# Patient Record
Sex: Female | Born: 1975 | Race: White | Hispanic: Yes | Marital: Single | Smoking: Never smoker
Health system: Southern US, Community
[De-identification: ages and names within clinical notes are randomized; demographics above are authoritative.]

## PROBLEM LIST (undated history)

## (undated) DIAGNOSIS — K219 Gastro-esophageal reflux disease without esophagitis: Secondary | ICD-10-CM

## (undated) HISTORY — DX: Gastro-esophageal reflux disease without esophagitis: K21.9

---

## 2007-01-09 ENCOUNTER — Inpatient Hospital Stay (HOSPITAL_COMMUNITY): Admission: AD | Admit: 2007-01-09 | Discharge: 2007-01-09 | Payer: Self-pay | Admitting: Gynecology

## 2007-03-16 ENCOUNTER — Ambulatory Visit: Payer: Self-pay | Admitting: *Deleted

## 2007-03-16 ENCOUNTER — Inpatient Hospital Stay (HOSPITAL_COMMUNITY): Admission: AD | Admit: 2007-03-16 | Discharge: 2007-03-16 | Payer: Self-pay | Admitting: Obstetrics & Gynecology

## 2007-03-23 ENCOUNTER — Inpatient Hospital Stay (HOSPITAL_COMMUNITY): Admission: AD | Admit: 2007-03-23 | Discharge: 2007-03-23 | Payer: Self-pay | Admitting: Family Medicine

## 2007-03-23 ENCOUNTER — Ambulatory Visit: Payer: Self-pay | Admitting: Obstetrics and Gynecology

## 2010-03-10 ENCOUNTER — Emergency Department (HOSPITAL_COMMUNITY): Admission: EM | Admit: 2010-03-10 | Discharge: 2010-03-11 | Payer: Self-pay | Admitting: Emergency Medicine

## 2010-09-08 LAB — URINALYSIS, ROUTINE W REFLEX MICROSCOPIC
Ketones, ur: NEGATIVE mg/dL
Protein, ur: NEGATIVE mg/dL
Urobilinogen, UA: 1 mg/dL (ref 0.0–1.0)

## 2010-09-08 LAB — WET PREP, GENITAL

## 2010-09-08 LAB — POCT PREGNANCY, URINE: Preg Test, Ur: NEGATIVE

## 2011-04-06 LAB — URINE MICROSCOPIC-ADD ON

## 2011-04-06 LAB — WET PREP, GENITAL: Yeast Wet Prep HPF POC: NONE SEEN

## 2011-04-06 LAB — URINALYSIS, ROUTINE W REFLEX MICROSCOPIC
Glucose, UA: 100 — AB
Glucose, UA: NEGATIVE
Leukocytes, UA: NEGATIVE
Nitrite: NEGATIVE
Nitrite: NEGATIVE
Protein, ur: NEGATIVE
Specific Gravity, Urine: 1.02
pH: 6
pH: 7

## 2011-04-06 LAB — RAPID URINE DRUG SCREEN, HOSP PERFORMED
Amphetamines: NOT DETECTED
Opiates: NOT DETECTED
Tetrahydrocannabinol: NOT DETECTED

## 2011-04-06 LAB — STREP B DNA PROBE

## 2011-04-06 LAB — CBC
HCT: 34.3 — ABNORMAL LOW
Hemoglobin: 11.7 — ABNORMAL LOW
MCHC: 34.1
Platelets: 253
RDW: 13.3

## 2011-04-06 LAB — KLEIHAUER-BETKE STAIN: Quantitation Fetal Hemoglobin: 0

## 2011-04-06 LAB — TYPE AND SCREEN

## 2011-04-10 LAB — CBC
Hemoglobin: 11.7 — ABNORMAL LOW
MCHC: 33.5
MCV: 93.9
RBC: 3.71 — ABNORMAL LOW
RDW: 13.8

## 2011-04-10 LAB — URINALYSIS, ROUTINE W REFLEX MICROSCOPIC
Bilirubin Urine: NEGATIVE
Nitrite: NEGATIVE
Specific Gravity, Urine: 1.02
Urobilinogen, UA: 0.2
pH: 6.5

## 2011-04-10 LAB — URINE MICROSCOPIC-ADD ON

## 2011-04-10 LAB — POCT PREGNANCY, URINE: Operator id: 257211

## 2011-04-10 LAB — WET PREP, GENITAL

## 2011-04-10 LAB — ABO/RH: ABO/RH(D): O POS

## 2011-04-10 LAB — GC/CHLAMYDIA PROBE AMP, GENITAL: Chlamydia, DNA Probe: NEGATIVE

## 2011-07-30 ENCOUNTER — Encounter (HOSPITAL_COMMUNITY): Payer: Self-pay | Admitting: Emergency Medicine

## 2011-07-30 ENCOUNTER — Emergency Department (HOSPITAL_COMMUNITY): Payer: Self-pay

## 2011-07-30 ENCOUNTER — Emergency Department (HOSPITAL_COMMUNITY)
Admission: EM | Admit: 2011-07-30 | Discharge: 2011-07-30 | Disposition: A | Discharge status: Home / Self Care | Payer: Self-pay | Attending: Emergency Medicine | Admitting: Emergency Medicine

## 2011-07-30 DIAGNOSIS — R1031 Right lower quadrant pain: Secondary | ICD-10-CM | POA: Insufficient documentation

## 2011-07-30 DIAGNOSIS — R10813 Right lower quadrant abdominal tenderness: Secondary | ICD-10-CM | POA: Insufficient documentation

## 2011-07-30 DIAGNOSIS — N390 Urinary tract infection, site not specified: Secondary | ICD-10-CM | POA: Insufficient documentation

## 2011-07-30 DIAGNOSIS — R300 Dysuria: Secondary | ICD-10-CM | POA: Insufficient documentation

## 2011-07-30 DIAGNOSIS — R3919 Other difficulties with micturition: Secondary | ICD-10-CM | POA: Insufficient documentation

## 2011-07-30 DIAGNOSIS — R110 Nausea: Secondary | ICD-10-CM | POA: Insufficient documentation

## 2011-07-30 DIAGNOSIS — A499 Bacterial infection, unspecified: Secondary | ICD-10-CM | POA: Insufficient documentation

## 2011-07-30 DIAGNOSIS — N949 Unspecified condition associated with female genital organs and menstrual cycle: Secondary | ICD-10-CM | POA: Insufficient documentation

## 2011-07-30 DIAGNOSIS — N760 Acute vaginitis: Secondary | ICD-10-CM | POA: Insufficient documentation

## 2011-07-30 DIAGNOSIS — B9689 Other specified bacterial agents as the cause of diseases classified elsewhere: Secondary | ICD-10-CM | POA: Insufficient documentation

## 2011-07-30 LAB — URINALYSIS, MICROSCOPIC ONLY
Glucose, UA: NEGATIVE mg/dL
Leukocytes, UA: NEGATIVE
Nitrite: NEGATIVE
Specific Gravity, Urine: 1.029 (ref 1.005–1.030)
pH: 6 (ref 5.0–8.0)

## 2011-07-30 LAB — POCT PREGNANCY, URINE: Preg Test, Ur: NEGATIVE

## 2011-07-30 LAB — WET PREP, GENITAL

## 2011-07-30 MED ORDER — NITROFURANTOIN MONOHYD MACRO 100 MG PO CAPS: 100.0000 mg | ORAL_CAPSULE | Freq: Two times a day (BID) | ORAL | Status: AC

## 2011-07-30 MED ORDER — METRONIDAZOLE 500 MG PO TABS: 500.0000 mg | ORAL_TABLET | Freq: Two times a day (BID) | ORAL | Status: AC

## 2011-07-30 NOTE — ED Notes (Signed)
C/o R sided pelvic pain, dysuria, and nausea x 2 weeks. LMP 06/13/11

## 2011-07-30 NOTE — ED Notes (Addendum)
Pt c/o right sided lower abdominal pelvic pain that started 2 weeks ago. Pt states it is hard to walk and hurts to pee (burning sensation). Pain feels like someone is pulling on her constantly. Last period December 18. Denies vaginal discharge. Pt has had nausea, and vomiting

## 2011-07-30 NOTE — ED Provider Notes (Signed)
History     CSN: [id number]  Arrival date & time 07/30/11  8295   First MD Initiated Contact with Patient 07/30/11 1902      Chief Complaint  Patient presents with  . Pelvic Pain    (Consider location/radiation/quality/duration/timing/severity/associated sxs/prior treatment) Patient is a 36 y.o. female presenting with pelvic pain. The history is provided by the patient.  Pelvic Pain This is a new problem. The current episode started 1 to 4 weeks ago. The problem occurs constantly. The problem has been gradually worsening. Associated symptoms include abdominal pain, nausea and urinary symptoms. Pertinent negatives include no change in bowel habit, chills, fever or vomiting.  Pt states pain is mainly to the right lower abdomen. Some pain with urination. Took two pregnancy tests at home, both negative. History of tubal ligation. Denies vaginal discharge or bleeding. Denies chest pain, back pain, diarrhea. Pain worsened with palpation and movement.   History reviewed. No pertinent past medical history.  History reviewed. No pertinent past surgical history.  No family history on file.  History  Substance Use Topics  . Smoking status: Never Smoker   . Smokeless tobacco: Not on file  . Alcohol Use: No    OB History    Grav Para Term Preterm Abortions TAB SAB Ect Mult Living                  Review of Systems  Constitutional: Negative for fever and chills.  HENT: Negative.   Eyes: Negative.   Respiratory: Negative.   Cardiovascular: Negative.   Gastrointestinal: Positive for nausea and abdominal pain. Negative for vomiting and change in bowel habit.  Genitourinary: Positive for dysuria and pelvic pain. Negative for vaginal bleeding and vaginal discharge.  Musculoskeletal: Negative.   Skin: Negative.   Neurological: Negative.   Psychiatric/Behavioral: Negative.     Allergies  Review of patient's allergies indicates not on file.  Home Medications  No current outpatient  prescriptions on file.  BP 145/75  Pulse 86  Temp(Src) 97.9 F (36.6 C) (Oral)  Resp 20  SpO2 99%  LMP 06/13/2011  Physical Exam  Constitutional: She is oriented to person, place, and time. She appears well-developed and well-nourished. No distress.  HENT:  Head: Normocephalic.  Eyes: Conjunctivae are normal.  Neck: Neck supple.  Cardiovascular: Normal rate, regular rhythm and normal heart sounds.   Pulmonary/Chest: Effort normal and breath sounds normal. No respiratory distress.  Abdominal: Soft. Bowel sounds are normal. She exhibits no mass. There is no rebound and no guarding.       Right lower abdominal tenderness, no guarding, no rebound tenderness  Genitourinary:       Normal external genetalia. Normal vaginal canal with white thin discharger. Cervix normal appearing. No CMT. Uterine and right adnexal tenderness with palpation. No adnexal masses palpated.  Neurological: She is alert and oriented to person, place, and time.  Skin: Skin is warm and dry. No erythema.  Psychiatric: She has a normal mood and affect.    ED Course  Procedures (including critical care time)   Labs Reviewed  URINALYSIS, WITH MICROSCOPIC  URINALYSIS, ROUTINE W REFLEX MICROSCOPIC  GC/CHLAMYDIA PROBE AMP, GENITAL  WET PREP, GENITAL   Pt with right adnexal pain. No mcburney pt tenderness, pain comes and goes for 2 wks, doubt appendicitis. Pelvic exam significant for right adnexal tenderness, otherwise normal. Will get [place] to r/o ovarian cyst. Urine pregn negative  Results for orders placed during the hospital encounter of 07/30/11  URINALYSIS, WITH MICROSCOPIC  Component Value Range   Color, Urine YELLOW  YELLOW    APPearance CLEAR  CLEAR    Specific Gravity, Urine 1.029  1.005 - 1.030    pH 6.0  5.0 - 8.0    Glucose, UA NEGATIVE  NEGATIVE (mg/dL)   Hgb urine dipstick NEGATIVE  NEGATIVE    Bilirubin Urine NEGATIVE  NEGATIVE    Ketones, ur NEGATIVE  NEGATIVE (mg/dL)   Protein, ur NEGATIVE   NEGATIVE (mg/dL)   Urobilinogen, UA 0.2  0.0 - 1.0 (mg/dL)   Nitrite NEGATIVE  NEGATIVE    Leukocytes, UA NEGATIVE  NEGATIVE    WBC, UA 3-6  <3 (WBC/hpf)   RBC / HPF 0-2  <3 (RBC/hpf)   Bacteria, UA MANY (*) RARE    Squamous Epithelial / LPF FEW (*) RARE    Urine-Other MUCOUS PRESENT    WET PREP, GENITAL      Component Value Range   Yeast Wet Prep HPF POC NONE SEEN  NONE SEEN    Trich, Wet Prep NONE SEEN  NONE SEEN    Clue Cells Wet Prep HPF POC FEW (*) NONE SEEN    WBC, Wet Prep HPF POC FEW (*) NONE SEEN   POCT PREGNANCY, URINE      Component Value Range   Preg Test, Ur NEGATIVE  NEGATIVE    US Transvaginal Non-ob  07/30/2011  *RADIOLOGY REPORT*  Clinical Data: Right pelvic pain  TRANSABDOMINAL AND TRANSVAGINAL ULTRASOUND OF PELVIS Technique:  Both transabdominal and transvaginal ultrasound examinations of the pelvis were performed. Transabdominal technique was performed for global imaging of the pelvis including uterus, ovaries, adnexal regions, and pelvic cul-de-sac.  Comparison: None.   It was necessary to proceed with endovaginal exam following the transabdominal exam to visualize the uterus and ovaries.  Findings:  Uterus: 44 x 54 x 80 mm, somewhat inhomogeneous in echotexture without discrete lesion.  Endometrium: 12.7 mm in thickness, mildly inhomogeneous without focal mass or lesion.  Right ovary:  21 x 23 x 34 mm with normal-appearing follicles  Left ovary: 23 x 26 x 42 mm, with normal-appearing follicles  Other findings: No free fluid or adnexal mass evident.  IMPRESSION:  1.  Mildly thickened endometrium. 2.  Unremarkable ovaries. 3.  No adnexal mass or free fluid.  Original Report Authenticated By: Osa Craver, M.D.   US Pelvis Complete  07/30/2011  *RADIOLOGY REPORT*  Clinical Data: Right pelvic pain  TRANSABDOMINAL AND TRANSVAGINAL ULTRASOUND OF PELVIS Technique:  Both transabdominal and transvaginal ultrasound examinations of the pelvis were performed. Transabdominal  technique was performed for global imaging of the pelvis including uterus, ovaries, adnexal regions, and pelvic cul-de-sac.  Comparison: None.   It was necessary to proceed with endovaginal exam following the transabdominal exam to visualize the uterus and ovaries.  Findings:  Uterus: 44 x 54 x 80 mm, somewhat inhomogeneous in echotexture without discrete lesion.  Endometrium: 12.7 mm in thickness, mildly inhomogeneous without focal mass or lesion.  Right ovary:  21 x 23 x 34 mm with normal-appearing follicles  Left ovary: 23 x 26 x 42 mm, with normal-appearing follicles  Other findings: No free fluid or adnexal mass evident.  IMPRESSION:  1.  Mildly thickened endometrium. 2.  Unremarkable ovaries. 3.  No adnexal mass or free fluid.  Original Report Authenticated By: Thora Lance III, M.D.    10:21 PM Few clue cells on wet prep, many bacteria in Urine. Will treat with antibiotics and follow up.  Pt otherwise non  toxic, normal VS other then slight elevation in BP at 145/75.   No diagnosis found.    MDM          Lottie Mussel, PA 07/30/11 2223

## 2011-07-30 NOTE — ED Provider Notes (Signed)
Medical screening examination/treatment/procedure(s) were performed by non-physician practitioner and as supervising physician I was immediately available for consultation/collaboration.   Gwyneth Sprout, MD 07/30/11 [phone]

## 2011-07-30 NOTE — ED Notes (Signed)
Went over discharge instructions with pt.

## 2013-02-01 ENCOUNTER — Encounter (HOSPITAL_COMMUNITY): Payer: Self-pay | Admitting: Emergency Medicine

## 2013-02-01 ENCOUNTER — Emergency Department (HOSPITAL_COMMUNITY)
Admission: EM | Admit: 2013-02-01 | Discharge: 2013-02-01 | Disposition: A | Discharge status: Home / Self Care | Payer: Self-pay | Attending: Emergency Medicine | Admitting: Emergency Medicine

## 2013-02-01 DIAGNOSIS — Z3202 Encounter for pregnancy test, result negative: Secondary | ICD-10-CM | POA: Insufficient documentation

## 2013-02-01 DIAGNOSIS — R42 Dizziness and giddiness: Secondary | ICD-10-CM | POA: Insufficient documentation

## 2013-02-01 DIAGNOSIS — R5381 Other malaise: Secondary | ICD-10-CM | POA: Insufficient documentation

## 2013-02-01 DIAGNOSIS — R5383 Other fatigue: Secondary | ICD-10-CM | POA: Insufficient documentation

## 2013-02-01 LAB — POCT PREGNANCY, URINE: Preg Test, Ur: NEGATIVE

## 2013-02-01 LAB — BASIC METABOLIC PANEL
CO2: 24 mEq/L (ref 19–32)
Calcium: 9 mg/dL (ref 8.4–10.5)
Chloride: 103 mEq/L (ref 96–112)
Potassium: 5.9 mEq/L — ABNORMAL HIGH (ref 3.5–5.1)
Sodium: 136 mEq/L (ref 135–145)

## 2013-02-01 LAB — CBC
MCH: 30.4 pg (ref 26.0–34.0)
Platelets: 234 10*3/uL (ref 150–400)
RBC: 4.21 MIL/uL (ref 3.87–5.11)
WBC: 6.4 10*3/uL (ref 4.0–10.5)

## 2013-02-01 MED ORDER — MECLIZINE HCL 25 MG PO TABS: 25.0000 mg | ORAL_TABLET | Freq: Four times a day (QID) | ORAL | Status: DC

## 2013-02-01 MED ORDER — SODIUM CHLORIDE 0.9 % IV BOLUS (SEPSIS)
500.0000 mL | Freq: Once | INTRAVENOUS | Status: AC
  Administered 2013-02-01: 500 mL via INTRAVENOUS

## 2013-02-01 MED ORDER — MECLIZINE HCL 25 MG PO TABS
25.0000 mg | ORAL_TABLET | Freq: Once | ORAL | Status: AC
  Administered 2013-02-01: 25 mg via ORAL
  Filled 2013-02-01: qty 1

## 2013-02-01 NOTE — ED Notes (Signed)
[id number]<AV> Expected date:<BR> Expected time:<BR> Means of arrival:<BR> Comments:<BR> Hold

## 2013-02-01 NOTE — ED Notes (Signed)
Pt reports that she is not feeling dizzy.

## 2013-02-01 NOTE — ED Notes (Signed)
Per pt's family member pt is dizzy, pt's states room is spinning and weakness needing help to ambulate.

## 2013-02-01 NOTE — ED Provider Notes (Signed)
CSN: [id number]     Arrival date & time 02/01/13  1823 History     First MD Initiated Contact with Patient 02/01/13 1910     Chief Complaint  Patient presents with  . Dizziness  . Weakness   (Consider location/radiation/quality/duration/timing/severity/associated sxs/prior Treatment) HPI Comments: 37 year old healthy Hispanic female presents to the emergency department with her daughter who is translating for her with complaints of dizziness beginning when she woke up yesterday morning. She is unable to tell whether or not the dizziness is a feeling of the room spinning or she herself spinning. States she feels weak and unsteady on her feet. Dizziness is intermittent, worse when she goes from a seated to standing position, relieved by laying down. When the dizziness comes on she gets double vision. She has never had symptoms like this in the past. Denies headache, nausea or vomiting. Denies alcohol use, drug use or any medications. No fever or chills.  Patient is a 37 y.o. female presenting with weakness. The history is provided by the patient and a relative. The history is limited by a language barrier. A language interpreter was used.  Weakness Associated symptoms include weakness. Pertinent negatives include no chest pain, chills, fever, headaches, nausea or vomiting.    History reviewed. No pertinent past medical history. History reviewed. No pertinent past surgical history. No family history on file. History  Substance Use Topics  . Smoking status: Never Smoker   . Smokeless tobacco: Not on file  . Alcohol Use: No   OB History   Grav Para Term Preterm Abortions TAB SAB Ect Mult Living                 Review of Systems  Constitutional: Negative for fever and chills.  Respiratory: Negative for shortness of breath.   Cardiovascular: Negative for chest pain.  Gastrointestinal: Negative for nausea and vomiting.  Neurological: Positive for dizziness and weakness. Negative for  headaches.  All other systems reviewed and are negative.    Allergies  Review of patient's allergies indicates no known allergies.  Home Medications  No current outpatient prescriptions on file. BP 122/78  Pulse 78  Temp(Src) 98.6 F (37 C) (Oral)  Resp 18  Ht 5\' 4"  (1.626 m)  Wt 173 lb (78.472 kg)  BMI 29.68 kg/m2  SpO2 99%  LMP 01/25/2013 Physical Exam  Nursing note and vitals reviewed. Constitutional: She is oriented to person, place, and time. She appears well-developed and well-nourished. No distress.  HENT:  Head: Normocephalic and atraumatic.  Right Ear: Tympanic membrane normal.  Left Ear: Tympanic membrane normal.  Mouth/Throat: Oropharynx is clear and moist.  Eyes: Conjunctivae and EOM are normal. Pupils are equal, round, and reactive to light.  Neck: Normal range of motion. Neck supple.  Cardiovascular: Normal rate, regular rhythm, normal heart sounds and intact distal pulses.   Pulmonary/Chest: Effort normal and breath sounds normal. No respiratory distress.  Abdominal: Soft. Bowel sounds are normal. She exhibits no distension. There is no tenderness.  Neurological: She is alert and oriented to person, place, and time. She has normal strength. No cranial nerve deficit or sensory deficit. She displays a negative Romberg sign. Gait (unsteady) abnormal. Coordination normal.  Skin: Skin is warm and dry. She is not diaphoretic.  Psychiatric: She has a normal mood and affect. Her behavior is normal.    ED Course   Procedures (including critical care time)  Labs Reviewed  CBC  BASIC METABOLIC PANEL   No results found. 1. Dizziness  2. Vertigo     MDM  Patient with dizziness, worse with standing. Neuro exam unremarkable other than unsteady gait. She was given antivert as her symptoms sounded like vertigo, and dizziness subsided. Gait improved. She does not have a PCP. Will be discharged with rx for antivert, resources for PCP follow up. Return precautions  discussed with patient and daughter who both verbalize understanding of plan and are agreeable.  Trevor Mace, PA-C 02/01/13 2108

## 2013-02-01 NOTE — ED Notes (Signed)
BMP grossly hemolyzed per Susy in lab; reported to [place], [place] and she is okay to not recollect and send pt home.

## 2013-02-06 NOTE — ED Provider Notes (Signed)
Medical screening examination/treatment/procedure(s) were performed by non-physician practitioner and as supervising physician I was immediately available for consultation/collaboration.  Derwood Kaplan, MD 02/06/13 [phone]

## 2013-06-09 ENCOUNTER — Ambulatory Visit: Payer: No Typology Code available for payment source | Attending: Internal Medicine | Admitting: Internal Medicine

## 2013-06-09 ENCOUNTER — Encounter: Payer: Self-pay | Admitting: Internal Medicine

## 2013-06-09 VITALS — BP 122/78 | HR 91 | Temp 98.8°F | Resp 17 | Wt 172.4 lb

## 2013-06-09 DIAGNOSIS — R519 Headache, unspecified: Secondary | ICD-10-CM | POA: Insufficient documentation

## 2013-06-09 DIAGNOSIS — Z23 Encounter for immunization: Secondary | ICD-10-CM

## 2013-06-09 DIAGNOSIS — N920 Excessive and frequent menstruation with regular cycle: Secondary | ICD-10-CM | POA: Insufficient documentation

## 2013-06-09 DIAGNOSIS — M62838 Other muscle spasm: Secondary | ICD-10-CM

## 2013-06-09 DIAGNOSIS — K5900 Constipation, unspecified: Secondary | ICD-10-CM | POA: Insufficient documentation

## 2013-06-09 DIAGNOSIS — N390 Urinary tract infection, site not specified: Secondary | ICD-10-CM | POA: Insufficient documentation

## 2013-06-09 DIAGNOSIS — R51 Headache: Secondary | ICD-10-CM | POA: Insufficient documentation

## 2013-06-09 DIAGNOSIS — Z139 Encounter for screening, unspecified: Secondary | ICD-10-CM

## 2013-06-09 DIAGNOSIS — R109 Unspecified abdominal pain: Secondary | ICD-10-CM | POA: Insufficient documentation

## 2013-06-09 LAB — LIPID PANEL
Cholesterol: 188 mg/dL (ref 0–200)
HDL: 56 mg/dL
LDL Cholesterol: 110 mg/dL — ABNORMAL HIGH (ref 0–99)
Total CHOL/HDL Ratio: 3.4 ratio
Triglycerides: 112 mg/dL
VLDL: 22 mg/dL (ref 0–40)

## 2013-06-09 LAB — COMPLETE METABOLIC PANEL WITHOUT GFR
ALT: 11 U/L (ref 0–35)
AST: 7 U/L (ref 0–37)
Albumin: 4.2 g/dL (ref 3.5–5.2)
Alkaline Phosphatase: 43 U/L (ref 39–117)
BUN: 9 mg/dL (ref 6–23)
CO2: 28 meq/L (ref 19–32)
Calcium: 9.1 mg/dL (ref 8.4–10.5)
Chloride: 106 meq/L (ref 96–112)
Creat: 0.66 mg/dL (ref 0.50–1.10)
GFR, Est African American: >89 mL/min
GFR, Est Non African American: >89 mL/min
Glucose, Bld: 92 mg/dL (ref 70–99)
Potassium: 4.1 meq/L (ref 3.5–5.3)
Sodium: 140 meq/L (ref 135–145)
Total Bilirubin: 0.5 mg/dL (ref 0.3–1.2)
Total Protein: 6.5 g/dL (ref 6.0–8.3)

## 2013-06-09 LAB — POCT URINALYSIS DIPSTICK
Nitrite, UA: NEGATIVE
pH, UA: 7

## 2013-06-09 LAB — CBC WITH DIFFERENTIAL/PLATELET
Basophils Absolute: 0 10*3/uL (ref 0.0–0.1)
Basophils Relative: 1 % (ref 0–1)
Eosinophils Absolute: 0.1 10*3/uL (ref 0.0–0.7)
Eosinophils Relative: 2 % (ref 0–5)
HCT: 38.3 % (ref 36.0–46.0)
Hemoglobin: 12.9 g/dL (ref 12.0–15.0)
Lymphocytes Relative: 34 % (ref 12–46)
Lymphs Abs: 1.8 10*3/uL (ref 0.7–4.0)
MCH: 29.7 pg (ref 26.0–34.0)
MCHC: 33.7 g/dL (ref 30.0–36.0)
MCV: 88.2 fL (ref 78.0–100.0)
Monocytes Absolute: 0.3 10*3/uL (ref 0.1–1.0)
Monocytes Relative: 6 % (ref 3–12)
Neutro Abs: 3 10*3/uL (ref 1.7–7.7)
Neutrophils Relative %: 57 % (ref 43–77)
Platelets: 251 10*3/uL (ref 150–400)
RBC: 4.34 MIL/uL (ref 3.87–5.11)
RDW: 13.5 % (ref 11.5–15.5)
WBC: 5.2 10*3/uL (ref 4.0–10.5)

## 2013-06-09 MED ORDER — CIPROFLOXACIN HCL 500 MG PO TABS: 500.0000 mg | ORAL_TABLET | Freq: Two times a day (BID) | ORAL | Status: DC

## 2013-06-09 MED ORDER — MAGNESIUM HYDROXIDE 400 MG/5ML PO SUSP: 5.0000 mL | Freq: Every day | ORAL | Status: DC | PRN

## 2013-06-09 MED ORDER — CYCLOBENZAPRINE HCL 5 MG PO TABS: 5.0000 mg | ORAL_TABLET | Freq: Every day | ORAL | Status: DC

## 2013-06-09 NOTE — Progress Notes (Signed)
Patient ID: Monica Hicks, female   DOB: 1975/10/11, 37 y.o.   MRN: [id number] MRN: [id number] Name: Monica Hicks  Sex: female Age: 37 y.o. DOB: Dec 07, 37, 1977  Allergies: Review of patient's allergies indicates no known allergies.  Chief Complaint  Patient presents with  . Headache  . Abdominal Pain    HPI: Patient is 37 y.o. female who comes for the first time to establish medical care, she reported to have history of headache and dizziness, went to the ER a few months ago was prescribed meclizine which she took and help her with her symptoms but for the last the 37 she had some headache especially on the back also involves the neck denies any fever chills any visual problems any chest pain or shortness of breath did reported to have lower abdominal pain on and off denies any nausea vomiting reported to have history of constipation she's not taking any medications denied any urinary symptoms, she also has history of menorrhagia has not been seen any gynecologist.  History reviewed. No pertinent past medical history.  Past Surgical History  Procedure Laterality Date  . Cesarean section        Medication List       This list is accurate as of: 06/09/13 11:08 AM.  Always use your most recent med list.               ciprofloxacin 500 MG tablet  Commonly known as:  CIPRO  Take 1 tablet (500 mg total) by mouth 2 (two) times daily.     cyclobenzaprine 5 MG tablet  Commonly known as:  FLEXERIL  Take 1 tablet (5 mg total) by mouth at bedtime.     magnesium hydroxide 400 MG/5ML suspension  Commonly known as:  MILK OF MAGNESIA  Take 5 mLs by mouth daily as needed for mild constipation.     meclizine 25 MG tablet  Commonly known as:  ANTIVERT  Take 1 tablet (25 mg total) by mouth 4 (four) times daily.        Meds ordered this encounter  Medications  . magnesium hydroxide (MILK OF MAGNESIA) 400 MG/5ML suspension    Sig: Take 5 mLs by mouth daily as needed for  mild constipation.    Dispense:  360 mL    Refill:  1  . ciprofloxacin (CIPRO) 500 MG tablet    Sig: Take 1 tablet (500 mg total) by mouth 2 (two) times daily.    Dispense:  10 tablet    Refill:  0  . cyclobenzaprine (FLEXERIL) 5 MG tablet    Sig: Take 1 tablet (5 mg total) by mouth at bedtime.    Dispense:  30 tablet    Refill:  0     There is no immunization history on file for this patient.  History  Substance Use Topics  . Smoking status: Never Smoker   . Smokeless tobacco: Not on file  . Alcohol Use: No    Review of Systems  As noted in HPI  Filed Vitals:   06/09/13 1023  BP: 122/78  Pulse: 91  Temp: 98.8 F (37.1 C)  Resp: 17    Physical Exam  Physical Exam  Constitutional: No distress.  Eyes: EOM are normal. Pupils are equal, round, and reactive to light.  Neck:  Some tenderness on the neck on the sites and also at trapezius muscles, patient has some limitation with movement at the neck ? Spasm  Cardiovascular: Normal rate and regular rhythm.   Pulmonary/Chest: Breath  sounds normal. No respiratory distress. She has no wheezes. She has no rales.  Abdominal: She exhibits no distension.  Minimal suprapubic tenderness no rebound or guarding bowel sounds positive  Musculoskeletal:  Equal strength in all extremities    CBC    Component Value Date/Time   WBC 6.4 02/01/2013 1908   RBC 4.21 02/01/2013 1908   HGB 12.8 02/01/2013 1908   HCT 37.0 02/01/2013 1908   PLT 234 02/01/2013 1908   MCV 87.9 02/01/2013 1908    CMP     Component Value Date/Time   NA 136 02/01/2013 2050   K 5.9* 02/01/2013 2050   CL 103 02/01/2013 2050   CO2 24 02/01/2013 2050   GLUCOSE 94 02/01/2013 2050   BUN 9 02/01/2013 2050   CREATININE 0.65 02/01/2013 2050   CALCIUM 9.0 02/01/2013 2050   GFRNONAA >90 02/01/2013 2050   GFRAA >90 02/01/2013 2050    No results found for this basename: chol,  tri,  ldl    No components found with this basename: hga1c    No results found for this basename: AST     Assessment and Plan  Abdominal  pain, other specified site - Plan: Urinalysis Dipstick  LE positive, POCT urine pregnancy test negative  Headache(784.0) .... Tylenol/ibuprofen when necessary  Neck muscle spasm - Plan: cyclobenzaprine (FLEXERIL) 5 MG tablet  Unspecified constipation - Plan: magnesium hydroxide (MILK OF MAGNESIA) 400 MG/5ML suspension  Menorrhagia - Plan: Ambulatory referral to Gynecology  UTI (urinary tract infection) - Plan: ciprofloxacin (CIPRO) 500 MG tablet  Screening - Plan: CBC with Differential, COMPLETE METABOLIC PANEL WITH GFR, TSH, Lipid panel, Vit D  25 hydroxy (rtn osteoporosis monitoring)  Needs flu shot   Health Maintenance  -Pap Smear: referred to GYN   -Influenza shot given today   Return in about 6 weeks (around 07/21/2013).  Doris Cheadle, MD

## 2013-06-09 NOTE — Progress Notes (Signed)
Patient here for headache Complains the headache is very strong from the top of her down Down to the nape of her neck- dizzy Her vision was darkened and she could not lift her head Was seen a month or so ago in the ED for similar symptoms

## 2013-06-10 ENCOUNTER — Telehealth: Payer: Self-pay

## 2013-06-10 MED ORDER — VITAMIN D (ERGOCALCIFEROL) 1.25 MG (50000 UNIT) PO CAPS: 50000.0000 [IU] | ORAL_CAPSULE | ORAL | Status: DC

## 2013-06-10 NOTE — Telephone Encounter (Signed)
Patient not available  Interpreter  Line used Message was left on machine to return our call

## 2013-06-10 NOTE — Telephone Encounter (Signed)
Message copied by Lestine Mount on Tue Jun 10, 2013  4:08 PM ------      Message from: Doris Cheadle      Created: Tue Jun 10, 2013  1:36 PM       Blood work reviewed, noticed low vitamin D, call patient advise to start ergocalciferol 50,000 units once a week for the duration of  12 weeks.        ------

## 2013-07-21 ENCOUNTER — Ambulatory Visit: Payer: No Typology Code available for payment source | Admitting: Internal Medicine

## 2013-07-24 ENCOUNTER — Other Ambulatory Visit (HOSPITAL_COMMUNITY)
Admission: RE | Admit: 2013-07-24 | Discharge: 2013-07-24 | Disposition: A | Discharge status: Home / Self Care | Payer: No Typology Code available for payment source | Source: Ambulatory Visit | Attending: Obstetrics & Gynecology | Admitting: Obstetrics & Gynecology

## 2013-07-24 ENCOUNTER — Ambulatory Visit (INDEPENDENT_AMBULATORY_CARE_PROVIDER_SITE_OTHER): Payer: No Typology Code available for payment source | Admitting: Obstetrics & Gynecology

## 2013-07-24 ENCOUNTER — Encounter: Payer: Self-pay | Admitting: Obstetrics & Gynecology

## 2013-07-24 VITALS — BP 117/79 | HR 82 | Ht 62.0 in | Wt 166.0 lb

## 2013-07-24 DIAGNOSIS — N920 Excessive and frequent menstruation with regular cycle: Secondary | ICD-10-CM

## 2013-07-24 DIAGNOSIS — Z01419 Encounter for gynecological examination (general) (routine) without abnormal findings: Secondary | ICD-10-CM

## 2013-07-24 DIAGNOSIS — R87619 Unspecified abnormal cytological findings in specimens from cervix uteri: Secondary | ICD-10-CM | POA: Insufficient documentation

## 2013-07-24 LAB — POCT PREGNANCY, URINE: Preg Test, Ur: NEGATIVE

## 2013-07-24 NOTE — Progress Notes (Signed)
Subjective:     Patient ID: Monica Hicks, female   DOB: 01-07-1976, 38 y.o.   MRN: [id number]  HPI Patient is a 38 yo [phone] female presenting for evaluation of menorrhagia. Patient states for the past 6 years she has had heavy periods. She notes bleeding once a month for 6 days. She uses 6 pads a day which she states are soaked when she changes them. She endorses some cramping at the start of her periods. She does note having one month during this time frame which she had 2 periods in the month. She is currently not on birth control. She is sexually active with her husband only. She notes no history of STD's.  She had an ultrasound 07/30/11 with a mildly thickened endometrium of 12.7 mm and otherwise normal appearance of the uterus.   Review of Systems see HPI     Objective:   Physical Exam  Constitutional: She appears well-developed and well-nourished. No distress.  HENT:  Head: Normocephalic and atraumatic.  Mouth/Throat: Oropharynx is clear and moist.  Eyes: Pupils are equal, round, and reactive to light.  Cardiovascular: Normal rate, regular rhythm and normal heart sounds.   Pulmonary/Chest: Effort normal and breath sounds normal.  Abdominal: Soft.  Genitourinary:  External vagina without abnormalities, vaginal mucosa normal, cervix without lesions noted, bimanual exam performed by Dr Ihor Dow without any cervical motion tenderness or palpable uterine or adnexal masses  Skin: Skin is warm and dry.   ENDOMETRIAL BIOPSY     The indications for endometrial biopsy were reviewed.   Risks of the biopsy including cramping, bleeding, infection, uterine perforation, inadequate specimen and need for additional procedures  were discussed. The patient states she understands and agrees to undergo procedure today. Consent was signed. Time out was performed.  During the pelvic exam, the cervix was prepped with Betadine. A single-toothed tenaculum was placed on the anterior lip of the  cervix to stabilize it. The 3 mm pipelle was introduced into the endometrial cavity without difficulty, and a moderate amount of tissue was obtained and sent to pathology. The instruments were removed from the patient's vagina. Minimal bleeding from the cervix was noted. The patient tolerated the procedure well. Routine post-procedure instructions were given to the patient.       Assessment:     Menorrhagia     Plan:     Will follow-up endometrial biopsy results and pap smear results. Patient with desire to become pregnant. Discussed options for treatment of heavy periods (OCPs and IUD). Patient declined these at this time with her goal to become pregnant. Will have the patient follow-up in one year.

## 2013-07-24 NOTE — Patient Instructions (Addendum)
Biopsia de endometrio - Pharmacist, community (Endometrial Biopsy, Care After) Siga estas instrucciones durante las prximas semanas. Estas indicaciones le proporcionan informacin general acerca de cmo deber cuidarse despus del procedimiento. El mdico tambin podr darle instrucciones ms especficas. El tratamiento se ha planificado de acuerdo a las prcticas mdicas actuales, pero a veces se producen problemas. Comunquese con el mdico si tiene algn problema o tiene dudas despus del procedimiento. QU ESPERAR DESPUS DEL PROCEDIMIENTO Despus del procedimiento, es tpico tener las siguientes sensaciones:  Sentir clicos leves y tendr una pequea cantidad de sangrado vaginal durante algunos das despus del procedimiento. Esto es normal. INSTRUCCIONES PARA EL CUIDADO EN EL HOGAR  Tome slo medicamentos de venta libre o recetados, segn las indicaciones del mdico.  No utilice tampones, duchas vaginales ni tenga relaciones sexuales hasta que el profesional la autorice.  Siga las indicaciones del mdico relacionadas con la restriccin a ciertas actividades, como ejercicios fsicos intensos o levantar objetos pesados. SOLICITE ATENCIN MDICA SI:  Tiene un sangrado abundante o sangra durante ms de 2 das despus del procedimiento.  Advierte un olor ftido que proviene de la vagina.  Siente escalofros o tiene fiebre.  Siente un dolor en el bajo vientre (abdominal) muy intenso. SOLICITE ATENCIN MDICA DE INMEDIATO SI:  Siente clicos intensos en el estmago o en la espalda.  Elimina cogulos grandes.  La hemorragia aumenta.  Se siente mareada, dbil, o se desmaya. Document Released: 04/02/2013 [facility] Patient Information 2014 [place], [place]. Menorragia (Menorrhagia) Se llama menorragia a los periodos menstruales abundantes o que duran ms de lo habitual. En la menorragia, la prdida de sangre y los clicos en cada perodo pueden hacerle imposible seguir con sus  actividades habituales. CAUSAS  En algunos casos, la causa es desconocida, pero hay ciertas afecciones que pueden causar menorragia. Las causas ms frecuentes son: Un problema con la glndula tiroides productora de hormonas (hipotiroidismo). Formaciones no cancerosas en el tero (plipos o fibromas). Un desequilibrio entre las hormonas estrgeno y Education officer, museum. Uno de sus ovarios no libera vulos durante uno o ms meses. Efectos secundarios por haberse colocado un dispositivo intrauterino (DIU). Efectos secundarios por algunos medicamentos, como antiinflamatorios o anticoagulantes. Trastornos hemorrgicos que impiden la Location manager. SIGNOS Y SNTOMAS  Durante un perodo normal, el sangrado duran entre 4 y [address]. Los signos de que el perodo es muy abundante son: Maxie Barb rutinaria tiene que cambiar el apsito o el tampn cada 1 o 2 horas debido a que est completamente empapado. Elimina cogulos ms grandes que 1 pulgada (2,5 cm). Tiene sangrado durante ms de 7 das. Necesita usar apsitos y tampones al mismo tiempo porque pierde [employer]. Debe levantarse para cambiarse el apsito o el tampn durante la noche. Tiene sntomas de anemia como cansancio, fatiga o falta de aire. DIAGNSTICO  El [employer] har un examen fsico y le har preguntas sobre sus sntomas y su historia menstrual. Podr indicarle otros estudios segn lo que encuentre Programmer, applications. Estos estudios pueden ser: Anlisis de sangre: para verificar si est embarazada o tiene cambios hormonales, trastornos de la tiroides o de sangrado, el nivel de hierro (anemia), u otros problemas. Biopsia de endometrio: el mdico tomar Lauris Poag de tejido del interior del tero para que sea examinado con un microscopio. Ecografa plvica: en este estudio se utilizan ondas de sonido para tomar imgenes del tero, los ovarios y Sales executive. Las imgenes pueden mostrar si tiene fibromas u otros crecimientos. Histeroscopa:  para [employer], el mdico usar un pequeo telescopio  para mirar el interior del tero. Segn sean los [employer] estudios iniciales, el mdico podr indicar ms [employer]. TRATAMIENTO  Puede ser que no sea necesario un tratamiento mdico. Si lo necesita, en principio el mdico podr recomendarle un tratamiento con uno o ms medicamentos. Si no se reduce el sangrado lo suficiente, el tratamiento quirrgico podra ser [employer]. El mejor tratamiento para usted depender de:  Si necesita evitar un embarazo. Si desea tener hijos en el futuro. La causa y la gravedad del sangrado. Su opinin o preferencia personal. Algunos medicamentos para la menorragia son: Anticonceptivos que contengan hormonas: se incluyen las pldoras anticonceptivas, el parche en la piel, el anillo vaginal, inyecciones que se aplican cada 3 meses, el DIU hormonal y el implante. Estos tratamientos reducen [employer] perodos [place]. Medicamentos que espesan la sangre y hacen ms lento el sangrado. Medicamentos que reducen la inflamacin, como el ibuprofeno. Medicamentos que contienen una hormona sinttica llamada progestina. Medicamentos que [employer] ovarios dejen de funcionar durante un breve lapso. Podra ser necesario un tratamiento quirrgico para la menorragia si los medicamentos no son efectivos. Las opciones de tratamiento incluyen: Copywriter, advertising y curetaje [facility]): en este procedimiento, el mdico abre (dilata) el cuello del tero y luego raspa o succiona tejido de la membrana que cubre el tero para reducir el sangrado menstrual. Histeroscopa quirrgica: en este procedimiento se Margretta Sidle u pequeo tubo con una luz(histeroscopio) para ver la cavidad uterina y ayudar en la extirpacin quirrgica de un plipo que puede ser la causa de perodos abundantes. Ablacin del endometrio: a travs de Engineer, structural, el mdico destruye de [place] permanente toda la membrana interna del tero (endometrio).  Luego de la ablacin del endometrio, la mayora de las mujeres tienen escaso flujo menstrual, o no lo tienen. La ablacin del endometrio reduce la posibilidad de quedar embarazada. Reseccin del endometrio: en este procedimiento quirrgico se utiliza un asa de Environmental manager para extirpar la membrana del tero. Este procedimiento tambin reduce la posibilidad de [place]. Histerectoma: la remocin [place] del tero y el cuello uterino es un procedimiento permanente que elimina los perodos [place]. El embarazo no es posible luego de Physicist, medical. Este procedimiento requiere de anestesia y hospitalizacin. [place] slo medicamentos de venta libre o recetados, segn las indicaciones del [place] los medicamentos recetados tal como se le indic. No cambie ni reemplace medicamentos sin consultarlo con Writer. Las hemorragias de larga duracin pueden traer como consecuencia una disminucin en los niveles de hierro. El mdico podr recetarle hierro. Esto ayuda a Air cabin crew que el organismo pierde luego de un sangrado abundante. Tmelo tal como se le indic. El hierro puede causarle constipacin. Si esto es un problema, aumente el consumo de [place], frutas y [place]. No tome aspirina o medicamentos que la contengan desde una semana antes del perodo menstrual ni durante el mismo. La aspirina puede hacer que la hemorragia empeore. Si necesita cambiar el apsito o el tampn mas de una vez cada 2 horas, permanezca en cama y descanse todo lo posible hasta que la hemorragia se detenga. Siga una dieta balanceada. Consuma alimentos ricos en hierro. Por ejemplo, vegetales de [employer], carne, hgado, huevos y panes y Actor de grano entero. No trate de perder peso hasta que la hemorragia anormal se detenga y los niveles de hierro en la sangre vuelvan a la normalidad. SOLICITE ATENCIN MDICA SI:  Empapa un tampn o un apsito  cada  1 o 2 horas, y [employer] ocurre cada vez que tiene el perodo. Necesita usar apsitos y tampones al mismo tempo porque pierde [employer]. Debe cambiarse el apsito o el tampn durante la noche. Tiene un perodo que dura ms de 8 das. Usted tiene perodos irregulares que ocurren con ms o menos frecuencia que una vez al mes. Se siente mareada o sufre un desmayo. Se siente muy dbil o cansada. Le falta el aire o siente que el corazn late muy rpido al hacer ejercicios. Tiene nuseas y vmitos o diarrea mientras toma los medicamentos. Tiene algn problema que pueda relacionarse con el medicamento que est tomando. SOLICITE ATENCIN MDICA DE INMEDIATO SI:  Empapa 4 o ms apsitos o tampones en 2 horas. Tiene sangrado y est embarazada. ASEGRESE DE QUE:  Comprende estas instrucciones. Controlar su afeccin. Recibir ayuda de inmediato si no mejora o si empeora. Document Released: 03/22/2005 Document Revised: 04/02/2013 [facility] Patient Information 2014 [place], [place]. Menorragia (Menorrhagia) Se llama menorragia a los periodos menstruales abundantes o que duran ms de lo habitual. En la menorragia, la prdida de sangre y los clicos en cada perodo pueden hacerle imposible seguir con sus actividades habituales. CAUSAS  En algunos casos, la causa es desconocida, pero hay ciertas afecciones que pueden causar menorragia. Las causas ms frecuentes son:  Un problema con la glndula tiroides productora de hormonas (hipotiroidismo).  Formaciones no cancerosas en el tero (plipos o fibromas).  Un desequilibrio entre las hormonas estrgeno y Education officer, museum.  Uno de sus ovarios no libera vulos durante uno o ms meses.  Efectos secundarios por haberse colocado un dispositivo intrauterino (DIU).  Efectos secundarios por algunos medicamentos, como antiinflamatorios o anticoagulantes.  Trastornos hemorrgicos que impiden la Location manager. SIGNOS Y SNTOMAS  Durante un  perodo normal, el sangrado duran entre 4 y [address]. Los signos de que el perodo es muy abundante son:  Maxie Barb rutinaria tiene que cambiar el apsito o el tampn cada 1 o 2 horas debido a que est completamente empapado.  Elimina cogulos ms grandes que 1 pulgada (2,5 cm).  Tiene sangrado durante ms de 7 das.  Necesita usar apsitos y tampones al mismo tiempo porque pierde [employer].  Debe levantarse para cambiarse el apsito o el tampn durante la noche.  Tiene sntomas de anemia como cansancio, fatiga o falta de aire. DIAGNSTICO  El [employer] har un examen fsico y le har preguntas sobre sus sntomas y su historia menstrual. Podr indicarle otros estudios segn lo que encuentre Programmer, applications. Estos estudios pueden ser:  Anlisis de sangre: para verificar si est embarazada o tiene cambios hormonales, trastornos de la tiroides o de sangrado, el nivel de hierro (anemia), u otros problemas.  Biopsia de endometrio: el mdico tomar Lauris Poag de tejido del interior del tero para que sea examinado con un microscopio.  Ecografa plvica: en este estudio se utilizan ondas de sonido para tomar imgenes del tero, los ovarios y Sales executive. Las imgenes pueden mostrar si tiene fibromas u otros crecimientos.  Histeroscopa: para [employer], el mdico usar un pequeo telescopio para Careers information officer interior del tero. Segn sean los [employer] estudios iniciales, el mdico podr indicar ms [employer]. TRATAMIENTO  Puede ser que no sea necesario un tratamiento mdico. Si lo necesita, en principio el mdico podr recomendarle un tratamiento con uno o ms medicamentos. Si no se reduce el sangrado lo suficiente, el tratamiento quirrgico podra ser [employer]. El mejor tratamiento para usted depender de:  Si necesita evitar un embarazo.  Si desea tener hijos en el futuro.  La causa y la gravedad del sangrado.  Su opinin o preferencia personal. Algunos medicamentos para  la menorragia son:  Anticonceptivos que contengan hormonas: se incluyen las pldoras anticonceptivas, el parche en la piel, el anillo vaginal, inyecciones que se aplican cada 3 meses, el DIU hormonal y el implante. Estos tratamientos reducen [employer] perodos [place].  Medicamentos que espesan la sangre y hacen ms lento el sangrado.  Medicamentos que reducen la inflamacin, como el ibuprofeno.  Medicamentos que contienen una hormona sinttica llamada progestina.  Medicamentos que [employer] ovarios dejen de funcionar durante un breve lapso. Podra ser necesario un tratamiento quirrgico para la menorragia si los medicamentos no son efectivos. Las opciones de tratamiento incluyen:  Copywriter, advertising y curetaje [facility]): en este procedimiento, el mdico abre (dilata) el cuello del tero y luego raspa o succiona tejido de la membrana que cubre el tero para reducir el sangrado menstrual.  Histeroscopa quirrgica: en este procedimiento se Margretta Sidle u pequeo tubo con una luz(histeroscopio) para ver la cavidad uterina y ayudar en la extirpacin quirrgica de un plipo que puede ser la causa de perodos abundantes.  Ablacin del endometrio: a travs de Engineer, structural, el mdico destruye de [place] permanente toda la membrana interna del tero (endometrio). Luego de la ablacin del endometrio, la mayora de las mujeres tienen escaso flujo menstrual, o no lo tienen. La ablacin del endometrio reduce la posibilidad de quedar embarazada.  Reseccin del endometrio: en este procedimiento quirrgico se utiliza un asa de Environmental manager para extirpar la membrana del tero. Este procedimiento tambin reduce la posibilidad de [place].  Histerectoma: la remocin [place] del tero y el cuello uterino es un procedimiento permanente que elimina los perodos [place]. El embarazo no es posible luego de Physicist, medical. Este procedimiento requiere de anestesia y  hospitalizacin. [place] slo medicamentos de venta libre o recetados, segn las indicaciones del [place] los medicamentos recetados tal como se le indic. No cambie ni reemplace medicamentos sin consultarlo con Writer.  Las hemorragias de larga duracin pueden traer como consecuencia una disminucin en los niveles de hierro. El mdico podr recetarle hierro. Esto ayuda a Air cabin crew que el organismo pierde luego de un sangrado abundante. Tmelo tal como se le indic. El hierro puede causarle constipacin. Si esto es un problema, aumente el consumo de [place], frutas y [place].  No tome aspirina o medicamentos que la contengan desde una semana antes del perodo menstrual ni durante el mismo. La aspirina puede hacer que la hemorragia empeore.  Si necesita cambiar el apsito o el tampn mas de una vez cada 2 horas, permanezca en cama y descanse todo lo posible hasta que la hemorragia se detenga.  Siga una dieta balanceada. Consuma alimentos ricos en hierro. Por ejemplo, vegetales de Boeing, carne, hgado, huevos y panes y Actor de grano entero. No trate de perder peso hasta que la hemorragia anormal se detenga y los niveles de hierro en la sangre vuelvan a la normalidad. SOLICITE ATENCIN MDICA SI:   Empapa un tampn o un apsito cada 1 o 2 horas, y [employer] ocurre cada vez que tiene el perodo.  Necesita usar apsitos y tampones al mismo tempo porque pierde [employer].  Debe cambiarse el apsito o el tampn durante la noche.  Tiene un perodo que dura ms de 8 das.  Usted tiene  perodos irregulares que ocurren con ms o menos frecuencia que una vez al mes.  Se siente mareada o sufre un desmayo.  Se siente muy dbil o cansada.  Le falta el aire o siente que el corazn late muy rpido al hacer ejercicios.  Tiene nuseas y vmitos o diarrea mientras toma los medicamentos.  Tiene algn problema que pueda relacionarse  con el medicamento que est tomando. SOLICITE ATENCIN MDICA DE INMEDIATO SI:   Empapa 4 o ms apsitos o tampones en 2 horas.  Tiene sangrado y est embarazada. ASEGRESE DE QUE:   Comprende estas instrucciones.  Controlar su afeccin.  Recibir ayuda de inmediato si no mejora o si empeora. Document Released: 03/22/2005 Document Revised: 04/02/2013 [facility] Patient Information 2014 [place].

## 2013-07-24 NOTE — Progress Notes (Signed)
Patient ID: Monica Hicks, female   DOB: [birth year], 38 y.o.   MRN: [id number] Attestation of Attending Supervision of Resident: Evaluation and management procedures were performed by the [facility] Medicine Resident under my supervision.  I have seen and examined the patient, reviewed the resident's note and chart, and I agree with the management and plan.  Pt will take OCP's and f/u when she becomes pregnant or in 1 year.  Lasandra Beech, M.D. 07/24/2013 3:21 PM

## 2013-07-28 ENCOUNTER — Telehealth: Payer: Self-pay

## 2013-07-28 ENCOUNTER — Encounter: Payer: Self-pay | Admitting: *Deleted

## 2013-07-28 NOTE — Telephone Encounter (Signed)
Message copied by Geanie Logan on Mon Jul 28, 2013  1:40 PM ------      Message from: Lavonia Drafts      Created: Mon Jul 28, 2013 10:47 AM       Please call pt.  Let her know that her Endo bx is normal.  F/u in 1 year or sooner prn                  Thx,      clh-S ------

## 2013-07-29 ENCOUNTER — Encounter: Payer: Self-pay | Admitting: *Deleted

## 2013-07-29 NOTE — Telephone Encounter (Signed)
Message copied by Sue Lush on Tue Jul 29, 2013  9:03 AM ------      Message from: Verita Schneiders A      Created: Mon Jul 28, 2013  4:43 PM       Benign endometrial biopsy. Please call to inform patient of results. ------

## 2013-07-29 NOTE — Telephone Encounter (Signed)
Attempted to call patient: line busy, will send letter.   Letter sent

## 2013-08-04 ENCOUNTER — Encounter: Payer: Self-pay | Admitting: *Deleted

## 2013-08-27 ENCOUNTER — Encounter: Payer: Self-pay | Admitting: *Deleted

## 2014-02-09 ENCOUNTER — Emergency Department (HOSPITAL_COMMUNITY)
Admission: EM | Admit: 2014-02-09 | Discharge: 2014-02-09 | Disposition: A | Discharge status: Home / Self Care | Payer: Self-pay | Attending: Emergency Medicine | Admitting: Emergency Medicine

## 2014-02-09 ENCOUNTER — Encounter (HOSPITAL_COMMUNITY): Payer: Self-pay | Admitting: Emergency Medicine

## 2014-02-09 ENCOUNTER — Emergency Department (HOSPITAL_COMMUNITY): Payer: Self-pay

## 2014-02-09 DIAGNOSIS — K259 Gastric ulcer, unspecified as acute or chronic, without hemorrhage or perforation: Secondary | ICD-10-CM

## 2014-02-09 DIAGNOSIS — Z79899 Other long term (current) drug therapy: Secondary | ICD-10-CM | POA: Insufficient documentation

## 2014-02-09 DIAGNOSIS — Z9889 Other specified postprocedural states: Secondary | ICD-10-CM | POA: Insufficient documentation

## 2014-02-09 DIAGNOSIS — R109 Unspecified abdominal pain: Secondary | ICD-10-CM | POA: Insufficient documentation

## 2014-02-09 DIAGNOSIS — Z3202 Encounter for pregnancy test, result negative: Secondary | ICD-10-CM | POA: Insufficient documentation

## 2014-02-09 LAB — URINALYSIS, ROUTINE W REFLEX MICROSCOPIC
Bilirubin Urine: NEGATIVE
GLUCOSE, UA: NEGATIVE mg/dL
Hgb urine dipstick: NEGATIVE
Ketones, ur: NEGATIVE mg/dL
Nitrite: NEGATIVE
PH: 6.5 (ref 5.0–8.0)
Protein, ur: NEGATIVE mg/dL
SPECIFIC GRAVITY, URINE: 1.012 (ref 1.005–1.030)
Urobilinogen, UA: 0.2 mg/dL (ref 0.0–1.0)

## 2014-02-09 LAB — URINE MICROSCOPIC-ADD ON

## 2014-02-09 LAB — CBC WITH DIFFERENTIAL/PLATELET
BASOS PCT: 1 % (ref 0–1)
Basophils Absolute: 0 10*3/uL (ref 0.0–0.1)
EOS ABS: 0.1 10*3/uL (ref 0.0–0.7)
Eosinophils Relative: 1 % (ref 0–5)
HCT: 40.5 % (ref 36.0–46.0)
Hemoglobin: 14 g/dL (ref 12.0–15.0)
LYMPHS ABS: 2.3 10*3/uL (ref 0.7–4.0)
Lymphocytes Relative: 35 % (ref 12–46)
MCH: 30.4 pg (ref 26.0–34.0)
MCHC: 34.6 g/dL (ref 30.0–36.0)
MCV: 87.9 fL (ref 78.0–100.0)
Monocytes Absolute: 0.7 10*3/uL (ref 0.1–1.0)
Monocytes Relative: 10 % (ref 3–12)
Neutro Abs: 3.4 10*3/uL (ref 1.7–7.7)
Neutrophils Relative %: 53 % (ref 43–77)
Platelets: 220 10*3/uL (ref 150–400)
RBC: 4.61 MIL/uL (ref 3.87–5.11)
RDW: 13.2 % (ref 11.5–15.5)
WBC: 6.5 10*3/uL (ref 4.0–10.5)

## 2014-02-09 LAB — COMPREHENSIVE METABOLIC PANEL
ALT: 15 U/L (ref 0–35)
AST: 8 U/L (ref 0–37)
Albumin: 4.1 g/dL (ref 3.5–5.2)
Alkaline Phosphatase: 52 U/L (ref 39–117)
Anion gap: 13 (ref 5–15)
BUN: 12 mg/dL (ref 6–23)
CO2: 24 meq/L (ref 19–32)
Calcium: 9.7 mg/dL (ref 8.4–10.5)
Chloride: 102 mEq/L (ref 96–112)
Creatinine, Ser: 0.63 mg/dL (ref 0.50–1.10)
GFR calc Af Amer: >90 mL/min (ref >90)
GFR calc non Af Amer: >90 mL/min (ref >90)
Glucose, Bld: 88 mg/dL (ref 70–99)
Potassium: 4 mEq/L (ref 3.7–5.3)
Sodium: 139 mEq/L (ref 137–147)
TOTAL PROTEIN: 7.6 g/dL (ref 6.0–8.3)
Total Bilirubin: 0.4 mg/dL (ref 0.3–1.2)

## 2014-02-09 LAB — LIPASE, BLOOD: Lipase: 24 U/L (ref 11–59)

## 2014-02-09 LAB — POC URINE PREG, ED: PREG TEST UR: NEGATIVE

## 2014-02-09 MED ORDER — FAMOTIDINE 20 MG PO TABS: 20.0000 mg | ORAL_TABLET | Freq: Two times a day (BID) | ORAL | Status: DC

## 2014-02-09 MED ORDER — SODIUM CHLORIDE 0.9 % IV BOLUS (SEPSIS)
1000.0000 mL | Freq: Once | INTRAVENOUS | Status: AC
  Administered 2014-02-09: 1000 mL via INTRAVENOUS

## 2014-02-09 MED ORDER — PANTOPRAZOLE SODIUM 40 MG IV SOLR
40.0000 mg | Freq: Once | INTRAVENOUS | Status: AC
  Administered 2014-02-09: 40 mg via INTRAVENOUS
  Filled 2014-02-09: qty 40

## 2014-02-09 MED ORDER — HYDROMORPHONE HCL PF 1 MG/ML IJ SOLN
1.0000 mg | Freq: Once | INTRAMUSCULAR | Status: AC
  Administered 2014-02-09: 1 mg via INTRAVENOUS
  Filled 2014-02-09: qty 1

## 2014-02-09 MED ORDER — ONDANSETRON 4 MG PO TBDP: 4.0000 mg | ORAL_TABLET | Freq: Three times a day (TID) | ORAL | Status: DC | PRN

## 2014-02-09 NOTE — ED Provider Notes (Signed)
CSN: [id number]     Arrival date & time 02/09/14  1205 History   First MD Initiated Contact with Patient 02/09/14 1214     Chief Complaint  Patient presents with  . Abdominal Pain     (Consider location/radiation/quality/duration/timing/severity/associated sxs/prior Treatment) Patient is a 38 y.o. female presenting with abdominal pain.  Abdominal Pain Associated symptoms: constipation   Associated symptoms: no chest pain, no chills, no diarrhea, no dysuria, no fever, no nausea, no shortness of breath, no vaginal bleeding, no vaginal discharge and no vomiting     Ms Peil is a 38 year old Spanish speaking woman with history of menorrhagia who presents with 1 day of abdominal pain. History obtained with phone interpreter. She said this began acutely yesterday at ~1pm. It is a sharp epigastric pain that radiates to her back. Eating makes it worse. She has baseline constipation (last BM 8/12), denies nausea or emesis. No hematochezia or melena. She says she has had episodes like this in the past that resolve on their own, last episode over a month ago. She is unsure when this all first started and says that she has never been evaluated by a physician. Only medication is pepto bismol and denies alcohol or drugs. LMP ~ 2 weeks ago.  History reviewed. No pertinent past medical history. Past Surgical History  Procedure Laterality Date  . Cesarean section     No family history on file. History  Substance Use Topics  . Smoking status: Never Smoker   . Smokeless tobacco: Not on file  . Alcohol Use: No   OB History   Grav Para Term Preterm Abortions TAB SAB Ect Mult Living   4 3 3  1 1    3      Review of Systems  Constitutional: Negative for fever, chills, diaphoresis, appetite change and unexpected weight change.  Respiratory: Negative for shortness of breath.   Cardiovascular: Negative for chest pain and palpitations.  Gastrointestinal: Positive for abdominal pain and  constipation. Negative for nausea, vomiting, diarrhea and blood in stool.  Endocrine: Negative for polyuria.  Genitourinary: Negative for dysuria, vaginal bleeding and vaginal discharge.      Allergies  Review of patient's allergies indicates no known allergies.  Home Medications   Prior to Admission medications   Medication Sig Start Date End Date Taking? Authorizing Provider  famotidine (PEPCID) 20 MG tablet Take 1 tablet (20 mg total) by mouth 2 (two) times daily. 02/09/14   Kelby Aline, MD  ondansetron (ZOFRAN ODT) 4 MG disintegrating tablet Take 1 tablet (4 mg total) by mouth every 8 (eight) hours as needed for nausea or vomiting. 02/09/14   Kelby Aline, MD   BP 138/87  Pulse 82  Temp(Src) 97.8 F (36.6 C) (Oral)  Resp 20  SpO2 99%  LMP 01/19/2014 Physical Exam  Vitals reviewed. Constitutional: She is oriented to person, place, and time. She appears well-developed and well-nourished. No distress.  HENT:  Head: Normocephalic and atraumatic.  Mouth/Throat: Oropharynx is clear and moist.  Eyes: EOM are normal. Pupils are equal, round, and reactive to light. No scleral icterus.  Cardiovascular: Normal rate, regular rhythm, normal heart sounds and intact distal pulses.  Exam reveals no gallop and no friction rub.   No murmur heard. Pulmonary/Chest: Effort normal and breath sounds normal. No respiratory distress.  Abdominal: Soft. Bowel sounds are normal. She exhibits no distension. There is tenderness.  Diffuse upper quadrant tenderness worse in the LUQ. Questionable L CVA tenderness, none on right.  No Murphy's sign  Musculoskeletal: She exhibits no edema.  Neurological: She is alert and oriented to person, place, and time.  Skin: She is not diaphoretic.    ED Course  Procedures (including critical care time) Labs Review Labs Reviewed  URINALYSIS, ROUTINE W REFLEX MICROSCOPIC - Abnormal; Notable for the following:    Leukocytes, UA SMALL (*)    All other components  within normal limits  URINE MICROSCOPIC-ADD ON - Abnormal; Notable for the following:    Bacteria, UA FEW (*)    All other components within normal limits  CBC WITH DIFFERENTIAL  COMPREHENSIVE METABOLIC PANEL  LIPASE, BLOOD  POC URINE PREG, ED    Imaging Review US Abdomen Limited  02/09/2014   CLINICAL DATA:  Upper abdominal pain  EXAM: US ABDOMEN LIMITED - RIGHT UPPER QUADRANT  COMPARISON:  None.  FINDINGS: Gallbladder:  No gallstones or wall thickening visualized. There is no pericholecystic fluid. No sonographic Murphy sign noted.  Common bile duct:  Diameter: 2 mm. There is no intrahepatic or extrahepatic biliary duct dilatation.  Liver:  No focal lesion identified.  Liver echogenicity is increased.  IMPRESSION: Increased liver echogenicity. This finding most likely represents hepatic steatosis. While no focal liver lesions are identified, it must be cautioned that the sensitivity of ultrasound for focal liver lesions is diminished in this circumstance. Study otherwise unremarkable.   Electronically Signed   By: Lowella Grip M.D.   On: 02/09/2014 14:27     EKG Interpretation None      MDM   Final diagnoses:  Gastric ulcer, unspecified chronicity    1:14PM: Patient has 1 day sharp epigastric pain radiating to her back with previous sporadic episodes that have resolved on their own. Differential includes gastric or duodenal ulcer, gastritis, GERD, pancreatitis, biliary colic, nephrolithiasis. Will get bHCG, abdominal u/s, CBC, CMP, lipase, UA. Will treat symptoms with 1 L NS, dilaudid 1 mg iv, protonix 40 mg iv.  3:51PM: Pain improved. W/u with normal lipase, CMP, CBC, negative bHCG. UA few bacteria but patient denies urinary symptoms. Likely secondary to ulcer v GERD with hemoglobin wnl. Will refer to [facility] and provide PPI and zofran Rx for further outpatient workup. This was discussed with the patient using a phone interpreter and patient given clear  indications to return to ED such as new or worsening abdominal pain, nausea, emesis, fevers. She had opportunity to answer questions.  Kelby Aline, MD 02/09/14 [phone]

## 2014-02-09 NOTE — ED Notes (Signed)
Pt c/o abd pain that started 1300 yesterday. Denies n/v/d.

## 2014-02-09 NOTE — ED Provider Notes (Signed)
Patient seen and evaluated. Discussed with resident. Patient examined. Minimal epigastric tenderness without rebound. Symptoms for 24 hours. Similar episode several months ago. Negative evaluation. Normal ultrasound and hepatobiliary, and pancreatic enzymes. Plan be symptomatic treatment. Proton pump inhibitor. Precautions given.  Tanna Furry, MD 02/09/14 [phone]

## 2014-02-09 NOTE — Discharge Instructions (Signed)
You were seen today in the ED for abdominal pain. Please take famotidine twice a day and ondansetron every eight hours as needed for nausea. Please schedule a follow-up appointment within the next week at the number listed below. We recommend the [facility] Please seek medical attention or return to the ED if you have any new or worsening abdominal pain, nausea, vomiting, or other worrisome medical condition.   Emergency Department Resource Guide 1) Find a Doctor and Pay Out of Pocket Although you won't have to find out who is covered by your insurance plan, it is a good idea to ask around and get recommendations. You will then need to call the office and see if the doctor you have chosen will accept you as a new patient and what types of options they offer for patients who are self-pay. Some doctors offer discounts or will set up payment plans for their patients who do not have insurance, but you will need to ask so you aren't surprised when you get to your appointment.  2) Contact Your Local Health Department Not all health departments have doctors that can see patients for sick visits, but many do, so it is worth a call to see if yours does. If you don't know where your local health department is, you can check in your phone book. The CDC also has a tool to help you locate your state's health department, and many state websites also have listings of all of their local health departments.  3) Find a [facility] If your illness is not likely to be very severe or complicated, you may want to try a walk in clinic. These are popping up all over the country in pharmacies, drugstores, and shopping centers. They're usually staffed by nurse practitioners or physician assistants that have been trained to treat common illnesses and complaints. They're usually fairly quick and inexpensive. However, if you have serious medical issues or chronic medical problems, these are  probably not your best option.  No Primary Care Doctor: - Call Health Connect at  [phone] - they can help you locate a primary care doctor that  accepts your insurance, provides certain services, etc. - Physician Referral Service- [phone]  Chronic Pain Problems: Organization         Address  Phone   Notes  [facility]  [phone] Patients need to be referred by their primary care doctor.   Medication Assistance: Organization         Address  Phone   Notes  [facility] Medication [facility] [address] [place], [place] [address] [phone] --Must be a resident of [facility] -- Must have NO insurance coverage whatsoever (no Medicaid/ Medicare, etc.) -- The pt. MUST have a primary care doctor that directs their care regularly and follows them in the community   Med[facility]ist  [phone]   [employer]  [phone]    Agencies that provide inexpensive medical care: Organization         Address  Phone   Notes  [place]  [phone]   [facility]    [phone]   [facility] [place], [place] [address] [phone]   [place] [address] [place] [phone]   Planned Parenthood    [phone]   [facility]    [phone]  Community Health and [place]  [address], [place] Phone:  [phone], Fax:  [phone] Hours of Operation:  9 am - 6 pm, M-F.  Also accepts Medicaid/Medicare and self-pay.  [facility] for [place], [address], [place] Phone: [phone], Fax: [phone]. Hours of Operation:  8:30 am - 5:30 pm, M-F.  Also accepts Medicaid and self-pay.  [facility] [place] [address] [place] Phone: [phone]   [place], [place], [place] [phone], Ext. [phone] Mondays & Thursdays:  7-9 AM.  First 15 patients are seen on a first come, first serve basis.    [place] Providers:  Organization         Address  Phone   Notes  [facility] [address],  [phone] Also accepts self-pay patients.  [facility] [address], [place]  [phone]   [place], [address], [place] [phone]   [facility] [address] [place] [phone]   Lucianne Lei [address], [place]   [phone] Only accepts [place] Access [place] patients after they have their name applied to their card.   Self-Pay (no insurance) in [facility]:  Organization         Address  Phone   Notes  Sickle Cell Patients, [facility] [place] [phone]   [facility] [place] [phone]   [facility] [place]  [place], [place], [place] [phone]   [facility]/Dr. Osei-Bonsu  [address], [place] or [address], [place] [phone] Phone number for both [place] and [place] locations is the same.  Urgent Medical and [facility] [address], [place] [phone]   [facility] [address] [place] or [address] [phone]   [facility] [address], [place] [phone], phone; [phone], fax Sees patients 1st and 3rd Saturday of every month.  Must not qualify for public or private insurance (i.e. Medicaid, Medicare, [place] Health Choice, Veterans' Benefits)  Household income should be no more than 200% of the poverty level The clinic cannot treat you if you are pregnant or think you are pregnant  Sexually transmitted diseases are not treated at the clinic.     Dental Care: Organization         Address  Phone  Notes  [facility] [place] [phone] Accepts children up to age 46 who are enrolled in [place] or [place]; pregnant women with a Medicaid card; and children who have applied for Medicaid or [place] Health Choice, but were declined, whose parents can pay a reduced fee at time of service.  [facility] of [facility]  [address], [place] [phone] Accepts children up to age 58 who are enrolled in [place] or [place]; pregnant women with a Medicaid card; and children who have applied for Medicaid or [place] Health Choice, but were declined, whose parents can pay a reduced fee at time of service.  [place]  [phone]  [place] [phone] Patients are seen by appointment only. Walk-ins are not accepted. [place] will see patients 50 years of age and older. Monday - Tuesday (8am-5pm) Most Wednesdays (8:30-5pm) $30 per visit, cash only  [facility] Adult Dental Access PROGRAM  [address], [facility] [phone] Patients are seen by appointment only. Walk-ins are not accepted. [place] will see patients 104 years of age and older. One Wednesday Evening (Monthly: Volunteer Based).  $30 per visit, cash only  [place]  [phone] for adults; Children under age 13, call Graduate Pediatric Dentistry at [phone]. Children aged 62-14, please call [phone] to request a pediatric application.  Dental services are provided in all areas of dental care including fillings, crowns and bridges, complete and partial dentures, implants, gum treatment, root canals, and extractions. Preventive care is also provided. Treatment is provided to both adults and children. Patients are selected via a lottery and there is often a waiting  list.   [facility] [address] [place]  [phone] [email or web address]   [facility] [address] [place], [place] [phone], Ext. [phone] Second and Fourth Thursday of each month, opens at 6:30 AM; Clinic ends at 9 AM.  Patients are seen on a first-come first-served basis, and a limited number are seen during each clinic.   [facility]  [address] Hillard Danker [place], [place] [phone]   Eligibility Requirements You must have lived in [place], [place], or [place] counties for at least the last three months.   You cannot be eligible for state or federal sponsored [employer], including [employer], [place], or [employer].   You generally cannot be eligible for healthcare insurance through your employer.    How to apply: Eligibility screenings are held every Tuesday and Wednesday afternoon from 1:00 pm until 4:00 pm. You do not need an appointment for the interview!  [facility] [address], [place], [place]   [place]  [place] Department  [place]  [phone]    Behavioral Health Resources in the Community: Intensive Outpatient Programs Organization         Address  Phone  Notes  [place]. [address] [place], [place] [phone]   [facility] Outpatient [address] [place], [place]   ADS: Alcohol & Drug Svcs [address], [place], [place]   [place] [address],  [place], [place] or [phone]   Substance Abuse Resources Organization         Address  Phone  Notes  Alcohol and Drug Services  [phone]   [place]  [phone]   The [place]   Chinita Pester  [phone]   Residential & Outpatient Substance Abuse Program   [phone]   Psychological Services Organization         Address  Phone  Notes  [facility] [place]  [place]  [phone]   [place] [address] [place] or [phone]    Mobile Crisis Teams Organization         Address  Phone  Notes  [facility], Mobile Crisis Care Unit  [phone]   [facility]  [address] [place], [place]  [facility] [address], [address] [place] [phone]    Self-Help/Support Groups Organization         Address  Phone             Notes  [facility]ental [facility]oc[facility] of [place] - variety of support groups  [place] Call for more information  Narcotics Anonymous (NA), [facility] [address], [employer] [place]  2 meetings at this location   Special educational needs teacher         Address  Phone  Notes  [facility]idential [facility] [place],    [place]  [phone]   [facility]  [address] [place] [address]016, [place], [place]   [place], [place] [phone] Admissions: 8am-3pm M-F  Incentives Substance [place] [address],    [place], [place] [phone]   [facility] [address] [place], [place], [place]   [facility] [address]  [place], [place]   [facility] Programs - Intensive Outpatient [address] Kristeen [address], [place], [place]   [facility] ([place].,  [place], [place] [phone] or [phone]   [facility] Services (RTS) [address], [place], [place] Accepts Medicaid  [facility] [place] [address]  [place] [phone] Substance Abuse/Addiction Treatment   [facility] Organization          Address  Phone  Notes  [facility]  [phone]   Domenic Schwab, PhD [address] Arlis [place], [place]   2818698019 or [phone]   [place], [place] [phone]   [facility] [address] [place], [place] [phone] Insurance/Medicaid/sponsorship through [facility] and Families [address] [place]                                    [place], [place] [phone] [place] [address], [place] [phone]    Dr. Adele Schilder  [phone]   [facility] Dept. 1) [address] [place] 2) [place] 3)  [place] [address], [place] [phone]  [phone]   [place] [phone] or [phone] (After Hours)

## 2014-02-10 NOTE — ED Provider Notes (Signed)
I saw and evaluated the patient, reviewed the resident's note and I agree with the findings and plan.   EKG Interpretation None      Pt evaluated by me independently.  Also d/w Dr. Ethelene Hal.   I agree with plan as above.  Pt with recurrent symptoms, and negative eval for biliary source. Minimal epigastric tenderness.  Tanna Furry, MD 02/10/14 [phone]

## 2014-02-18 ENCOUNTER — Ambulatory Visit: Payer: Self-pay | Attending: Internal Medicine | Admitting: Internal Medicine

## 2014-02-18 ENCOUNTER — Encounter: Payer: Self-pay | Admitting: Internal Medicine

## 2014-02-18 VITALS — BP 109/76 | HR 73 | Temp 98.0°F | Resp 16 | Ht 62.0 in | Wt 186.2 lb

## 2014-02-18 DIAGNOSIS — K219 Gastro-esophageal reflux disease without esophagitis: Secondary | ICD-10-CM

## 2014-02-18 DIAGNOSIS — Z23 Encounter for immunization: Secondary | ICD-10-CM

## 2014-02-18 MED ORDER — FAMOTIDINE 20 MG PO TABS: 20.0000 mg | ORAL_TABLET | Freq: Two times a day (BID) | ORAL | Status: DC

## 2014-02-18 NOTE — Progress Notes (Signed)
HFU GERD Pt stated has no Sx at this time

## 2014-02-18 NOTE — Progress Notes (Signed)
Patient ID: Monica Hicks, female   DOB: [birth year], 38 y.o.   MRN: [id number]  GGY:[id number]  [id number]  DOB - 1975/09/24     HPI: Monica Hicks is a 38 y.o. female here today to establish medical care.  Patient presents to clinic as a hospital follow up for epigastric pain and Gerd.  Patient was give pepcid and zofran since discharge and she denies symptoms today.  She denies symptoms since discharge. She has been using OTC pepcid which she notes improvement. She denies alcohol and tobacco use. She has no other c/o today.    Patient has No headache, No chest pain, No abdominal pain - No Nausea, No new weakness tingling or numbness, No Cough - SOB.  No Known Allergies Past Medical History  Diagnosis Date  . GERD (gastroesophageal reflux disease)    Current Outpatient Prescriptions on File Prior to Visit  Medication Sig Dispense Refill  . famotidine (PEPCID) 20 MG tablet Take 1 tablet (20 mg total) by mouth 2 (two) times daily.  60 tablet  0  . ondansetron (ZOFRAN ODT) 4 MG disintegrating tablet Take 1 tablet (4 mg total) by mouth every 8 (eight) hours as needed for nausea or vomiting.  90 tablet  0   No current facility-administered medications on file prior to visit.   Family History  Problem Relation Age of Onset  . GER disease Father    History   Social History  . Marital Status: Single    Spouse Name: N/A    Number of Children: N/A  . Years of Education: N/A   Occupational History  . Not on file.   Social History Main Topics  . Smoking status: Never Smoker   . Smokeless tobacco: Not on file  . Alcohol Use: No  . Drug Use: No  . Sexual Activity: Yes   Other Topics Concern  . Not on file   Social History Narrative  . No narrative on file    Review of Systems: Constitutional: Negative for fever, chills, diaphoresis, activity change, appetite change and fatigue. HENT: Negative for ear pain, nosebleeds, congestion, facial swelling, rhinorrhea,  neck pain, neck stiffness and ear discharge.  Eyes: Negative for pain, discharge, redness, itching and visual disturbance. Respiratory: Negative for cough, choking, chest tightness, shortness of breath, wheezing and stridor.  Cardiovascular: Negative for chest pain, palpitations and leg swelling. Gastrointestinal: . Negative for abdominal distention. + constipation Genitourinary: Negative for dysuria, urgency, frequency, hematuria, flank pain, decreased urine volume, difficulty urinating and dyspareunia.  Musculoskeletal: Negative for back pain, joint swelling, arthralgia and gait problem. Neurological: Negative for dizziness, tremors, seizures, syncope, facial asymmetry, speech difficulty, weakness, light-headedness, numbness and headaches.  Hematological: Negative for adenopathy. Does not bruise/bleed easily. Psychiatric/Behavioral: Negative for hallucinations, behavioral problems, confusion, dysphoric mood, decreased concentration and agitation.   Objective:   Filed Vitals:   02/18/14 1416  BP: 109/76  Pulse: 73  Temp: 98 F (36.7 C)  Resp: 16    Physical Exam: Constitutional: Patient appears well-developed and well-nourished. No distress. HENT: Normocephalic, atraumatic, External right and left ear normal. Oropharynx is clear and moist.  Eyes: Conjunctivae and EOM are normal. PERRLA, no scleral icterus. Neck: Normal ROM. Neck supple. No JVD. No tracheal deviation. No thyromegaly. CVS: RRR, S1/S2 +, no murmurs, no gallops, no carotid bruit.  Pulmonary: Effort and breath sounds normal, no stridor, rhonchi, wheezes, rales.  Abdominal: Soft. BS +, no distension, rebound or guarding. Epigastric tenderness  Musculoskeletal: Normal range of motion. No edema and no  tenderness.  Lymphadenopathy: No lymphadenopathy noted, cervical Neuro: Alert. Normal reflexes, muscle tone coordination. No cranial nerve deficit. Skin: Skin is warm and dry. No rash noted. Not diaphoretic. No erythema. No  pallor. Psychiatric: Normal mood and affect. Behavior, judgment, thought content normal.  Lab Results  Component Value Date   WBC 6.5 02/09/2014   HGB 14.0 02/09/2014   HCT 40.5 02/09/2014   MCV 87.9 02/09/2014   PLT 220 02/09/2014   Lab Results  Component Value Date   CREATININE 0.63 02/09/2014   BUN 12 02/09/2014   NA 139 02/09/2014   K 4.0 02/09/2014   CL 102 02/09/2014   CO2 24 02/09/2014    No results found for this basename: HGBA1C   Lipid Panel     Component Value Date/Time   CHOL 188 06/09/2013 1108   TRIG 112 06/09/2013 1108   HDL 56 06/09/2013 1108   CHOLHDL 3.4 06/09/2013 1108   VLDL 22 06/09/2013 1108   LDLCALC 110* 06/09/2013 1108       Assessment and plan:   Zakeria was seen today for no specified reason.  Diagnoses and associated orders for this visit:  Gastroesophageal reflux disease, esophagitis presence not specified - Continue famotidine (PEPCID) 20 MG tablet; Take 1 tablet (20 mg total) by mouth 2 (two) times daily.  Immunization due - Cancel: Tdap vaccine greater than or equal to 7yo IM     Return if symptoms worsen or fail to improve.  Due to language barrier, an interpreter was present during the history-taking and subsequent discussion (and for part of the physical exam) with this patient.    Chari Manning, NP-C [facility] [phone] 02/18/2014, 2:21 PM

## 2014-02-18 NOTE — Patient Instructions (Signed)
Reflujo gastroesofgico - Adultos  (Gastroesophageal Reflux Disease, Adult)  El reflujo gastroesofgico ocurre cuando el cido del estmago pasa al esfago. Cuando el cido entra en contacto con el esfago, el cido provoca dolor (inflamacin) en el esfago. Con el tiempo, pueden formarse pequeos agujeros (lceras) en el revestimiento del esfago. CAUSAS   Exceso de Engineer, site. Esto aplica presin [employer], lo que hace que el cido del estmago suba hacia el esfago.  El hbito de fumar Aumenta la produccin de cido en el [place].  El consumo de alcohol. Provoca disminucin de la presin en el esfnter esofgico inferior (vlvula o anillo de msculo entre el esfago y Product manager), permitiendo que el cido del estmago suba hacia el esfago.  Cenas a ltima hora del da y estmago lleno. Aumenta la presin y la produccin de cido en el estmago.  Malformacin en el esfnter esofgico inferior. A menudo no se halla causa.  SNTOMAS   Ardor y Aeronautical engineer parte inferior del pecho detrs del esternn y en la zona media del [place]. Puede ocurrir [employer]GM [employer]AGE por semana o ms a menudo.  Dificultad para tragar.  Dolor de Investment banker, operational.  Tos seca.  Sntomas similares al asma que incluyen sensacin de opresin en el pecho, falta de aire y sibilancias. DIAGNSTICO  El mdico diagnosticar el problema basndose en los sntomas. En algunos casos, se indican radiografas y otras pruebas para verificar si hay complicaciones o para comprobar el estado del estmago y Education administrator.  TRATAMIENTO  El mdico le indicar medicamentos de venta libre o recetados para ayudar a disminuir la produccin de cido. Consulte con su mdico antes de Art gallery manager o agregar cualquier medicamento nuevo.  INSTRUCCIONES PARA EL CUIDADO EN EL HOGAR   Modifique los factores que pueda cambiar. Consulte con su mdico para solicitar orientacin relacionada con la prdida de peso, dejar de fumar y el consumo de  alcohol.  Evite las comidas y bebidas que empeoran los [place], [place]:  [place] con cafena o alcohlicas.  Chocolate.  Sabores a English as a second language teacher.  Ajo y cebolla.  Comidas muy condimentadas.  Ctricos como naranjas, limones o limas.  Alimentos que contengan tomate, como salsas, [place] y pizza.  Alimentos fritos y Radio broadcast assistant.  Evite acostarse durante 3 horas antes de irse a dormir o antes de tomar una siesta.  Haga comidas pequeas durante Psychiatrist de 3 comidas abundantes.  Use ropas sueltas. No use nada apretado alrededor de la cintura que cause presin en el estmago.  Levante (eleve) la cabecera de la cama 6 a 8 pulgadas (15 a 20 cm) con bloques de madera. Usar almohadas extra no ayuda.  Solo tome medicamentos que se pueden comprar sin receta o recetados para el dolor, Tree surgeon o fiebre, como le indica el mdico.  No tome aspirina, ibuprofeno ni antiinflamatorios no esteroides. [place] [facility] [facility] SI:   [employer], el cuello, la [place], los dientes o la espalda.  El dolor aumenta o cambia la intensidad o la durancin.  Tiene nuseas, vmitos o sudoracin(diaforesis).  Siente falta de aire o dolor en el pecho, o se desmaya.  Vomita y el vmito tiene [place], es de color [place], [place], negro o es similar a la borra del caf o tiene [place].  Las heces son rojas, sanguinolentas o negras. Estos sntomas pueden ser signos de otros problemas, como enfermedades cardacas, hemorragias gstrias o sangrado esofgico.  ASEGRESE DE QUE:   Comprende estas instrucciones.  Controlar su enfermedad.  Solicitar ayuda de inmediato si no mejora o si empeora. Document Released: 03/22/2005 Document Revised: 09/04/2011 [facility] Patient Information 2015 [place]. This information is not intended to replace advice given to you by your health care provider. Make sure you discuss any questions you have with your health care  provider. Estreimiento (Constipation) Estreimiento significa que una persona tiene menos de tres evacuaciones en una semana, dificultad para defecar, o que las heces son secas, duras, o ms grandes que lo normal. A medida que envejecemos el estreimiento es ms comn. Si intenta curar el estreimiento con medicamentos que producen la evacuacin de las heces (laxantes), el problema puede empeorar. El uso prolongado de laxantes puede hacer que los msculos del colon se debiliten. Una dieta baja en fibra, no tomar suficientes lquidos y el uso de ciertos medicamentos pueden Agricultural engineer.  CAUSAS   Ciertos medicamentos, como los antidepresivos, analgsicos, suplementos de hierro, anticidos y diurticos.  Algunas enfermedades, como la diabetes, el sndrome del colon irritable, enfermedad de la tiroides, o depresin.  No beber suficiente agua.  No consumir suficientes alimentos ricos en fibra.  Situaciones de estrs o viajes.  Falta de actividad fsica o de ejercicio.  Ignorar la necesidad sbita de Landscape architect.  Uso en exceso de laxantes. SIGNOS Y SNTOMAS   Defecar menos de tres veces por semana.  Dificultad para defecar.  Tener las heces secas y duras, o ms grandes que las normales.  Sensacin de estar lleno o hinchado.  Dolor en la parte baja del abdomen.  No sentir alivio despus de defecar. DIAGNSTICO  El mdico le har una historia clnica y un examen fsico. Pueden hacerle exmenes adicionales para el estreimiento grave. Estos estudios pueden ser:  Un radiografa con enema de bario para examinar el recto, el colon y, en algunos casos, el intestino delgado.  Una sigmoidoscopia para examinar el colon inferior.  Una colonoscopia para examinar todo el colon. TRATAMIENTO  El tratamiento depender de la gravedad del estreimiento y de la causa. Algunos tratamientos nutricionales son beber ms lquidos y comer ms alimentos ricos en fibra. El cambio en el estilo  de vida incluye hacer ejercicios de [place] regular. Si estas recomendaciones para Animator dieta y en el estilo de vida no ayudan, el mdico le puede indicar el uso de laxantes de venta libre para ayudarlo a Landscape architect. Los medicamentos recetados se pueden prescribir si los medicamentos de venta libre no lo [place].  INSTRUCCIONES PARA EL CUIDADO EN EL HOGAR   Consuma alimentos con alto contenido de [place], como frutas, vegetales, cereales integrales y porotos.  Limite los alimentos procesados ricos en grasas y azcar, como las papas fritas, hamburguesas, galletas, dulces y refrescos.  Puede agregar un suplemento de fibra a su dieta si no obtiene lo suficiente de los alimentos.  Beba suficiente lquido para Consulting civil engineer orina clara o de color amarillo plido.  Haga ejercicio regularmente o segn las indicaciones del mdico.  Vaya al bao cuando sienta la necesidad de ir. No se aguante las ganas.  Tome solo medicamentos de venta libre o recetados, segn las indicaciones del mdico. No tome otros medicamentos para el estreimiento sin consultarlo antes con su mdico. SOLICITE ATENCIN MDICA DE INMEDIATO SI:   Observa sangre brillante en las heces.  El estreimiento dura ms de 4 das o [place].  Siente dolor abdominal o rectal.  Las heces son delgadas como un lpiz.  Pierde peso de [place] inexplicable. ASEGRESE DE QUE:   Comprende estas instrucciones.  Controlar  su afeccin.  Recibir ayuda de inmediato si no mejora o si empeora. Document Released: 07/02/2007 Document Revised: 06/17/2013 [facility] Patient Information 2015 [place]. This information is not intended to replace advice given to you by your health care provider. Make sure you discuss any questions you have with your health care provider.

## 2014-04-27 ENCOUNTER — Encounter: Payer: Self-pay | Admitting: Internal Medicine

## 2014-08-03 ENCOUNTER — Emergency Department (HOSPITAL_COMMUNITY)
Admission: EM | Admit: 2014-08-03 | Discharge: 2014-08-04 | Disposition: A | Discharge status: Home / Self Care | Payer: Self-pay | Attending: Emergency Medicine | Admitting: Emergency Medicine

## 2014-08-03 ENCOUNTER — Encounter (HOSPITAL_COMMUNITY): Payer: Self-pay | Admitting: *Deleted

## 2014-08-03 DIAGNOSIS — R0602 Shortness of breath: Secondary | ICD-10-CM | POA: Insufficient documentation

## 2014-08-03 DIAGNOSIS — R51 Headache: Secondary | ICD-10-CM | POA: Insufficient documentation

## 2014-08-03 DIAGNOSIS — R519 Headache, unspecified: Secondary | ICD-10-CM

## 2014-08-03 DIAGNOSIS — Z8719 Personal history of other diseases of the digestive system: Secondary | ICD-10-CM | POA: Insufficient documentation

## 2014-08-03 DIAGNOSIS — R200 Anesthesia of skin: Secondary | ICD-10-CM | POA: Insufficient documentation

## 2014-08-03 LAB — CBC
HCT: 41.7 % (ref 36.0–46.0)
Hemoglobin: 13.8 g/dL (ref 12.0–15.0)
MCH: 29.4 pg (ref 26.0–34.0)
MCHC: 33.1 g/dL (ref 30.0–36.0)
MCV: 88.9 fL (ref 78.0–100.0)
Platelets: 241 10*3/uL (ref 150–400)
RBC: 4.69 MIL/uL (ref 3.87–5.11)
RDW: 13.2 % (ref 11.5–15.5)
WBC: 7.1 10*3/uL (ref 4.0–10.5)

## 2014-08-03 LAB — BASIC METABOLIC PANEL
Anion gap: 9 (ref 5–15)
BUN: 11 mg/dL (ref 6–23)
CO2: 24 mmol/L (ref 19–32)
Calcium: 9.4 mg/dL (ref 8.4–10.5)
Chloride: 105 mmol/L (ref 96–112)
Creatinine, Ser: 0.68 mg/dL (ref 0.50–1.10)
GFR calc Af Amer: >90 mL/min (ref >90)
GFR calc non Af Amer: >90 mL/min (ref >90)
Glucose, Bld: 93 mg/dL (ref 70–99)
Potassium: 3.4 mmol/L — ABNORMAL LOW (ref 3.5–5.1)
Sodium: 138 mmol/L (ref 135–145)

## 2014-08-03 LAB — I-STAT TROPONIN, ED: Troponin i, poc: 0 ng/mL (ref 0.00–0.08)

## 2014-08-03 LAB — BRAIN NATRIURETIC PEPTIDE: B Natriuretic Peptide: 12.6 pg/mL (ref 0.0–100.0)

## 2014-08-03 NOTE — ED Notes (Signed)
Pt states she has a headache now, denies chest pain or shortness of breath now.

## 2014-08-03 NOTE — ED Notes (Signed)
Per pt and family - pt has been experiencing intermittent chest pain described as stabbing and associated shortness of breath that began yesterday. Pt also admits to experiencing bilat hand and face numbness during these episodes - pt denies hx of anxiety however admits to recent generalized fatigue and depression.

## 2014-08-04 ENCOUNTER — Emergency Department (HOSPITAL_COMMUNITY): Payer: Self-pay

## 2014-08-04 MED ORDER — SODIUM CHLORIDE 0.9 % IV BOLUS (SEPSIS)
1000.0000 mL | Freq: Once | INTRAVENOUS | Status: AC
  Administered 2014-08-04: 1000 mL via INTRAVENOUS

## 2014-08-04 MED ORDER — KETOROLAC TROMETHAMINE 15 MG/ML IJ SOLN
15.0000 mg | Freq: Once | INTRAMUSCULAR | Status: AC
  Administered 2014-08-04: 15 mg via INTRAVENOUS
  Filled 2014-08-04: qty 1

## 2014-08-04 MED ORDER — METOCLOPRAMIDE HCL 5 MG/ML IJ SOLN
10.0000 mg | Freq: Once | INTRAMUSCULAR | Status: AC
  Administered 2014-08-04: 10 mg via INTRAVENOUS
  Filled 2014-08-04: qty 2

## 2014-08-04 MED ORDER — DIPHENHYDRAMINE HCL 50 MG/ML IJ SOLN
12.5000 mg | Freq: Once | INTRAMUSCULAR | Status: AC
  Administered 2014-08-04: 12.5 mg via INTRAVENOUS
  Filled 2014-08-04: qty 1

## 2014-08-04 NOTE — ED Notes (Signed)
Patient transported to X-ray 

## 2014-08-04 NOTE — Discharge Instructions (Signed)
Dolor de Pensions consultant, preguntas frecuentes y sus respuestas (Headaches, Frequently Asked Questions) CEFALEAS MIGRAOSAS P: Qu es la migraa? Qu la ocasiona? Cmo puedo tratarla? R: En general, la migraa comienza como un dolor apagado. Luego progresa hacia un dolor, constante, punzante y como un latido. Sentir Copy las sienes. Podr sentir Aeronautical engineer parte anterior o posterior de la cabeza, o en uno o ambos lados. El dolor suele estar acompaado de una combinacin de:  Nuseas.  Vmitos.  Sensibilidad a la luz y los ruidos. Algunas personas (un 15%) experimentan un aura (ver abajo) antes de un ataque. La causa de la migraa se debe a reacciones qumicas del cerebro. El tratamiento para la migraa puede incluir medicamentos de [place]. Tambin puede incluir tcnicas de [place]. Estas incluyen entrenamientos para la relajacin y biorretroalimentacin.  P: Qu es un aura? R: Alrededor del 15% de las personas con migraa tiene un "aura". Es una seal de sntomas neurolgicos que ocurren antes de un dolor de cabeza por migraas. Podr ver lneas onduladas o irregulares, puntos o luces parpadeantes. Podr experimentar visin de tnel o puntos ciegos en uno o ambos ojos. El aura puede incluir alucinaciones visuales o auditivas (algo que se imagina). Puede incluir trastornos en el olfato (como olores extraos), el tacto o el gusto. Entre otros sntomas se incluyen:  Adormecimiento.  Sensacin de hormigueo.  Dificultad para recordar o Tax adviser. Estos episodios neurolgicos pueden durar hasta 60 minutos. Los sntomas desaparecern a medida que el dolor de cabeza comience. P:Qu es un disparador? R: Ciertos factores fsicos o Best boy a "disparar" una migraa. Estos son:  Alimentos.  Cambios hormonales.  Clima.  Estrs. Es importante recordar que los disparadores son diferentes entre si. Para ayudar a prevenir ataques de migraas, necesitar  descubrir cules son los Engineer, civil (consulting). Lleve un diario sobre sus dolores de [place]. Este es un buen modo para descubrir los disparadores. El Visual merchandiser en el momento de hablar con el profesional acerca de su enfermedad. P: El clima afecta en las migraas? R: La luz solar, el calor, la humedad y lo cambios drsticos en la presin Doctor, hospital a, o "disparar" un ataque de migraa en [employer]. Pero estudios han demostrado que el clima no acta como disparador para todas las personas con [place]. P: Cul es la relacin entre la migraa y la hormonas? R: Las hormonas inician y [place] funciones corporales. Las hormonas [employer] balance en el cuerpo dentro de los constantes cambios de [place]. Algunas veces, el nivel de hormonas en el cuerpo se desbalancea. Por ejemplo, durante la menstruacin, el embarazo o la [place]. Pueden ser la causa de un ataque de migraa. De hecho, alrededor de tres cuartos de las mujeres con migraa informan que sus ataques estn relacionados con el ciclo menstrual.  P: Aumenta el riesgo de sufrir un choque cardaco en las personas que padecen migraa? R: La probabilidad de que un ataque de migraa ocasione un ataque cardaco es muy remota. Esto no quiere Google persona que sufre de migraa no pueda tener un ataque cardaco asociado con ella. En las personas menores de 40 aos, el factor ms comn para un ataque es la [place]. Pero durante la vida de una persona, la ocurrencia de un dolor de cabeza por migraa est asociada con una reduccin en el riesgo de morir por un ataque cerebrovascular.  P: Cules son los medicamentos para la migraa? R: La  medicacin precisa se [place] para tratar el dolor de cabeza una vez que ha comenzado. Son ejemplos, medicamentos de [place], desinflamatorios sin esteroides, ergotamnicos y triptanos.  P: Qu son los triptanos? R: Lo triptanos son Rodena Piety clase de  medicamentos abortivos. Son especficos para tratar este problema. Los triptanos son vasoconstrictores. Moderan algunas reacciones qumicas del cerebro. Los triptanos trabajan como receptores del cerebro. Ayudan a Doctor, general practice de un neurotransmisor denominado serotonina. Se cree que las fluctuaciones en los [place] de serotonina son la causa principal de la migraa.  P: Son [employer] Par[employer] de venta libre para la migraa? R: Los medicamentos de [employer] pueden ser efectivos para Public house manager dolores leves a moderados y los sntomas asociados a la [place]. Pero deber consultar a un mdico antes de Oncologist tratamiento para la migraa.  P: Cules son los medicamentos de prevencin de la migraa? R: Se suele denominar tratamiento "profilctico" a los medicamentos para la prevencin de la migraa. Se utilizan para reducir Scientist, forensic, gravedad y duracin de los ataques de [place]. Son ejemplos de medicamentos de prevencin: antiepilpticos, antidepresivos, bloqueadores beta, bloqueadores de los canales de calcio y medicamentos antiinflamatorios sin esteroides. P:  Por qu se utilizan anticonvulsivantes para tratar la migraa? R: Durante los ltimos aos, ha habido un creciente inters en las drogas antiepilpticas para la prevencin de la migraa. A menudo se los conoce como "anticonvulsivantes". La epilepsia y la migraa suceden por reacciones similares en el cerebro.  P:  Por qu se utilizan antidepresivos para tratar la migraa? R: Los antidepresivos tpicamente se [place] para tratar a las personas con depresin. Pueden reducir la frecuencia de la migraa a travs de la regulacin de los niveles qumicos, como la serotonina, en el cerebro.  P:  Por qu se utilizan terapias alternativas para tratar la migraa? R: El trmino "terapias alternativas" suelen utilizarse para describir los tratamientos que se considera que estn por fuera de Doctor, hospital la medicina occidental  convencional. Son ejemplos de las terapias alternativas: la acupuntura, la acupresin y el yoga. Otra terapia alternativa comn es la terapia herbal. Se cree que algunas hierbas ayudan a [employer] de [place]. Siempre consulte con [employer] acerca de las terapias alternativas antes de [place]. Algunos productos herbales contienen arsnico y [place] toxinas. DOLORES DE CABEZA POR TENSIN P: Qu es un dolor de cabeza por tensin? Qu lo ocasiona? Cmo puedo tratarlo? R: Los dolores de cabeza por tensin ocurren al azar. A menudo son el resultado de estrs temporario, ansiedad, fatiga o ira. Los sntomas Research scientist (physical sciences) en las sienes, una sensacin como de tener una banda alrededor de la cabeza (un dolor que "presiona"). Los sntomas pueden incluir una sensacin de [place], de presin y Writer de los msculos de la cabeza y el cuello. El dolor comienza en la frente, sienes o en la parte posterior de la cabeza y el cuello. El tratamiento para los dolores de cabeza por tensin puede incluir medicamentos de [place]. Tambin puede incluir tcnicas de [place] con entrenamientos para la relajacin y biorretroalimentacin. CEFALEA EN RACIMOS P: Qu es una cefalea en racimos? Qu la ocasiona? Cmo puedo tratarla? R: La cefalea en racimos toma su nombre debido a que los ataques vienen en grupos. El dolor aparece con poco o ningn aviso. Normalmente ocurre de un lado de la cabeza. Muchas veces el dolor viene acompaado de un lagrimeo u ojo rojo y goteo de la nariz del mismo lado que Conservation officer, historic buildings. Se cree que la causa es  una reaccin en las sustancias qumicas del cerebro. Se describe como el caso ms grave e intenso de cualquier tipo de dolor de cabeza. El tratamiento incluye medicamentos bajo receta y oxgeno. CEFALEA SINUSAL P: Qu es una cefalea sinusal? Qu la ocasiona? Cmo puedo tratarla? R: Cuando se inflama una cavidad en los huesos de la cara y el crneo (sinus) ocasiona un dolor  localizado. Esta enfermedad generalmente es el resultado de una reaccin alrgica, un tumor o una infeccin. Si el dolor de cabeza est ocasionado por un bloqueo del sinus, como una infeccin, probablemente tendr [place]. Una imagen de rayos X confirmar el bloqueo del sinus. El tratamiento indicado por el mdico podr incluir antibiticos para la infeccin, y tambin antihistamnicos o descongestivos.  DOLOR DE CABEZA POR EFECTO "REBOTE" P: Qu es un dolor de cabeza por efecto "rebote"? Qu lo ocasiona? Cmo puedo tratarlo? R: Si se toman medicamentos para el dolor de cabeza muy a menudo puede llevar a la enfermedad conocida como "dolor de cabeza por rebote". Un patrn de abuso de medicamentos para el dolor de cabeza supone tomarlos ms de [employer] por semana o en cantidades excesivas. Esto significa ms que lo que indica el envase o el mdico. Con los dolores de cabeza por rebote, los medicamentos no slo dejan de Best boy sino que adems comienzan a Electronics engineer dolores de [place]. Los mdicos tratan los dolores de cabeza por rebote mediante la disminucin del medicamento del que se ha abusado. A veces el medico podr sustituir gradualmente por un tipo diferente de tratamiento o medicacin. Dejar de consumirlo podra ser difcil. El abuso regular de un medicamento aumenta el potencial que se produzcan efectos secundarios graves. Consulte con un mdico si utiliza regularmente medicamentos para Conservation officer, historic buildings de cabeza ms de dos das por semana o ms de lo que indica el envase. PREGUNTAS Y RESPUESTAS ADICIONALES P: Qu es la biorretroalimentacin? R: La biorretroalimentacin es un tratamiento de [place]. La biorretroalimentacin utiliza un equipamiento especial para controlar los movimientos involuntarios del cuerpo y las respuestas fsicas. La biorretroalimentacin controla:  Respiracin.  Pulso.  Latidos cardacos.  Temperatura.  Tensin muscular.  Actividad cerebrales. La  biorretroalimentacin le ayudar a mejorar y Government social research officer sus ejercicios de Systems developer. Aprender a Chief Technology Officer las respuestas fsicas relacionadas con el estrs. Una vez que se dominan las tcnicas no necesitar ms el equipamiento. P: Son hereditarios los dolores de [place]? R: Segn algunas estimaciones, aproximadamente 28 millones de estadounidenses sufren migraa. Cuatro de cada cinco (80%) informan una historia familiar de migraa. Los investigadores no pueden asegurar si se trata de un problema gentico o Retail banker. A pesar de esto, un nio tiene 50% de probabilidades de sufrir migraa si uno de sus padres la sufre. El nio tiene un 75% de probabilidades si ambos padres la sufren.  P. Puede un nio tener migraa? R: En el momento de ingresar a la escuela secundaria, la mayora de los jvenes han experimentado algn tipo de cefalea. Algunos abordajes o medicamentos seguros y Location manager las cefaleas o detenerlas luego de que han comenzado.  Marita Snellen tipo de especialista debe ver para diagnosticar y tratar una cefalea? R: Comience con su mdico de cabecera. Luther Hearing de su experiencia y abordaje de las cefaleas. Comente los mtodos de clasificacin, diagnstico y [place]. El profesional decidir si lo derivar a Teaching laboratory technician, segn los sntomas u otras enfermedades. El hecho de sufrir diabetes, Set designer, Social research officer, government, puede requerir un abordaje ms complejo. [facility]  para las Cefaleas) proporcionar, a pedido, Pharmacologist de los mdicos que son miembros de [place]. Document Released: 05/25/2008 Document Revised: 09/04/2011 [facility] Patient Information 2015 [place]. This information is not intended to replace advice given to you by your health care provider. Make sure you discuss any questions you have with your health care provider.  Dolor de cabeza general sin causa  (General Headache Without Cause)   EL dolor de  cabeza es un dolor o malestar que se siente en la zona de la cabeza o del cuello. Puede no tener una causa especfica. Hay muchas causas y tipos de dolores de [place]. Los ms comunes son:   Cefalea tensional.  Cefaleas migraosas.  Cefalea en brotes.  Cefaleas diarias crnicas. INSTRUCCIONES PARA EL CUIDADO EN EL HOGAR   [place]umpla con todas las citas programadas con su mdico o con el especialista al que lo hayan derivado.  Slo tome medicamentos de venta libre o recetados para Glass blower/designer o Health and safety inspector, segn las indicaciones de su mdico.  Cuando sienta dolor de cabeza acustese en un cuarto oscuro y tranquilo.  Lleve un registro diario para Neurosurgeon lo que Engineer, mining. Por ejemplo, escriba:  Lo que come y bebe.  Cunto tiempo duerme.  Todo cambio en la dieta o medicamentos.  Intente con masajes u otras tcnicas de relajacin.  Colquese compresas de hielo o calor en la cabeza y en el cuello. selos 3 a 4 veces por da de 15 a 20 minutos por vez, o como sea necesario.  Limite las situaciones de estrs.  Sintese con la espalda recta y no  tense los msculos.  Si fuma, deje de hacerlo.  Limite el consumo de bebidas alcohlicas.  Consuma menos cantidad de cafena o deje de tomarla.  Coma y duerma en horarios regulares.  Duerma entre 7 y 72 horas o como lo indique su [place] luces tenues si le molestan las luces brillantes y Actor dolor de [place]. SOLICITE ATENCIN MDICA SI:   Tiene problemas con los Haematologist.  Los medicamentos no Teaching laboratory technician.  El dolor de cabeza que senta habitualmente es diferente.  Tiene nuseas o vmitos. SOLICITE ATENCIN MDICA DE INMEDIATO SI:   El dolor se hace cada vez ms intenso.  Tiene fiebre.  Presenta rigidez en el cuello.  Sufre prdida de la visin.  Presenta debilidad muscular o prdida del control muscular.  Comienza a perder el equilibrio o tiene problemas para  caminar.  Sufre mareos o se desmaya.  Tiene sntomas graves que son diferentes a los primeros sntomas. ASEGRESE DE QUE:   Comprende estas instrucciones.  Controlar su enfermedad.  Solicitar ayuda de inmediato si no mejora o empeora. Document Released: 03/22/2005 Document Revised: 12/12/2011 [facility] Patient Information 2015 [place]. This information is not intended to replace advice given to you by your health care provider. Make sure you discuss any questions you have with your health care provider.

## 2014-08-04 NOTE — ED Provider Notes (Signed)
CSN: [id number]     Arrival date & time 08/03/14  1912 History   First MD Initiated Contact with Patient 08/03/14 2356     Chief Complaint  Patient presents with  . Chest Pain  . Shortness of Breath  . Numbness     (Consider location/radiation/quality/duration/timing/severity/associated sxs/prior Treatment) HPI   39yF with headache. Gradual onset yesterday. Diffuse. Achy. Constant. No appreciable exacerbating or relieving factors. Associated photophobia. Mild nausea. No fever or chills. No neck pain/stiffness. Denies significant HA history. Sometimes hands and face numb. Otherwise no neurological complaints. No blood thinners. Denies trauma.   Past Medical History  Diagnosis Date  . GERD (gastroesophageal reflux disease)    Past Surgical History  Procedure Laterality Date  . Cesarean section     Family History  Problem Relation Age of Onset  . GER disease Father    History  Substance Use Topics  . Smoking status: Never Smoker   . Smokeless tobacco: Not on file  . Alcohol Use: No   OB History    Gravida Para Term Preterm AB TAB SAB Ectopic Multiple Living   4 3 3  1 1    3      Review of Systems  All systems reviewed and negative, other than as noted in HPI.   Allergies  Review of patient's allergies indicates no known allergies.  Home Medications   Prior to Admission medications   Medication Sig Start Date End Date Taking? Authorizing Provider  famotidine (PEPCID) 20 MG tablet Take 1 tablet (20 mg total) by mouth 2 (two) times daily. Patient not taking: Reported on 08/03/2014 02/18/14   Lance Bosch, NP  ondansetron (ZOFRAN ODT) 4 MG disintegrating tablet Take 1 tablet (4 mg total) by mouth every 8 (eight) hours as needed for nausea or vomiting. Patient not taking: Reported on 08/03/2014 02/09/14   Kelby Aline, MD   BP 124/78 mmHg  Pulse 73  Temp(Src) 97.8 F (36.6 C) (Oral)  Resp 18  SpO2 97%  LMP 07/18/2014 (Exact Date) Physical Exam  Constitutional: She  appears well-developed and well-nourished. No distress.  HENT:  Head: Normocephalic and atraumatic.  Eyes: Conjunctivae are normal. Right eye exhibits no discharge. Left eye exhibits no discharge.  Neck: Normal range of motion. Neck supple.  No nuchal rigidity  Cardiovascular: Normal rate, regular rhythm and normal heart sounds.  Exam reveals no gallop and no friction rub.   No murmur heard. Pulmonary/Chest: Effort normal and breath sounds normal. No respiratory distress.  Abdominal: Soft. She exhibits no distension. There is no tenderness.  Musculoskeletal: She exhibits no edema or tenderness.  Neurological: She is alert. No cranial nerve deficit. She exhibits normal muscle tone. Coordination normal.  Gait steady  Skin: Skin is warm and dry.  Psychiatric: She has a normal mood and affect. Her behavior is normal. Thought content normal.  Nursing note and vitals reviewed.   ED Course  Procedures (including critical care time) Labs Review Labs Reviewed  BASIC METABOLIC PANEL - Abnormal; Notable for the following:    Potassium 3.4 (*)    All other components within normal limits  CBC  BRAIN NATRIURETIC PEPTIDE  I-STAT TROPOININ, ED    Imaging Review Dg Chest 2 View  08/04/2014   CLINICAL DATA:  Chest pain for 24 hr. Face and hands field numb. Shortness of breath.  EXAM: CHEST  2 VIEW  COMPARISON:  None.  FINDINGS: The heart size and mediastinal contours are within normal limits. Both lungs are clear.  The visualized skeletal structures are unremarkable.  IMPRESSION: No active cardiopulmonary disease.   Electronically Signed   By: Lucienne Capers M.D.   On: 08/04/2014 01:13     EKG Interpretation   Date/Time:  Monday August 03 2014 78:67:54 EST Ventricular Rate:  83 PR Interval:  141 QRS Duration: 88 QT Interval:  384 QTC Calculation: 451 R Axis:   19 Text Interpretation:  Sinus rhythm Abnormal R-wave progression, early  transition Baseline wander in multiple leads No  significant change since  last tracing Confirmed by Georgian Mcclory  MD, Manasi Dishon ([id number]) on 08/04/2014 12:16:38  AM      MDM   Final diagnoses:  SOB (shortness of breath)  Right temporal headache    39yF with HA. Suspect primary HA. Consider emergent secondary causes such as bleed, infectious or mass but doubt. There is no history of trauma. Pt has a nonfocal neurological exam. Afebrile and neck supple. No use of blood thinning medication. Consider ocular etiology such as acute angle closure glaucoma but doubt. Pt denies acute change in visual acuity and eye exam unremarkable. Doubt temporal arteritis given age, no temporal tenderness and temporal artery pulsations palpable. Doubt CO poisoning. No contacts with similar symptoms. Doubt venous thrombosis. Doubt carotid or vertebral arteries dissection. Symptoms improved with meds. Feel that can be safely discharged, but strict return precautions discussed. Outpt fu.     Virgel Manifold, MD 08/13/14 [phone]

## 2015-04-09 ENCOUNTER — Ambulatory Visit: Payer: Self-pay | Attending: Internal Medicine

## 2015-09-02 ENCOUNTER — Ambulatory Visit: Payer: Self-pay | Attending: Internal Medicine | Admitting: Internal Medicine

## 2015-09-02 ENCOUNTER — Encounter: Payer: Self-pay | Admitting: Internal Medicine

## 2015-09-02 VITALS — BP 122/81 | HR 90 | Temp 98.0°F | Resp 16 | Ht 62.0 in | Wt 177.0 lb

## 2015-09-02 DIAGNOSIS — K219 Gastro-esophageal reflux disease without esophagitis: Secondary | ICD-10-CM | POA: Insufficient documentation

## 2015-09-02 DIAGNOSIS — Z0000 Encounter for general adult medical examination without abnormal findings: Secondary | ICD-10-CM

## 2015-09-02 LAB — CBC WITH DIFFERENTIAL/PLATELET
Basophils Absolute: 0.1 10*3/uL (ref 0.0–0.1)
Basophils Relative: 1 % (ref 0–1)
EOS PCT: 2 % (ref 0–5)
Eosinophils Absolute: 0.2 10*3/uL (ref 0.0–0.7)
HCT: 39.9 % (ref 36.0–46.0)
Hemoglobin: 13.1 g/dL (ref 12.0–15.0)
LYMPHS ABS: 2.8 10*3/uL (ref 0.7–4.0)
Lymphocytes Relative: 35 % (ref 12–46)
MCH: 28.9 pg (ref 26.0–34.0)
MCHC: 32.8 g/dL (ref 30.0–36.0)
MCV: 88.1 fL (ref 78.0–100.0)
MPV: 11.6 fL (ref 8.6–12.4)
Monocytes Absolute: 0.7 10*3/uL (ref 0.1–1.0)
Monocytes Relative: 9 % (ref 3–12)
Neutro Abs: 4.2 10*3/uL (ref 1.7–7.7)
Neutrophils Relative %: 53 % (ref 43–77)
Platelets: 265 10*3/uL (ref 150–400)
RBC: 4.53 MIL/uL (ref 3.87–5.11)
RDW: 13.7 % (ref 11.5–15.5)
WBC: 7.9 10*3/uL (ref 4.0–10.5)

## 2015-09-02 NOTE — Progress Notes (Signed)
Patient ID: Monica Hicks, female   DOB: [birth year], 40 y.o.   MRN: [id number]  CC: annual pap   HPI: Monica Hicks is a 40 y.o. female here today for a follow up visit.  Patient has past medical history of GERD. Patient is not on any current medications and denies any changes to her medical history. She has been trying to get pregnant for the past 2 years and is currently seeing a fertility specialist. She is requesting blood test for her fertility specialist. She denies any pertinent family history. She denies alcohol, tobacco, or drug use. She is currently employed with 3 children. She does not desire pap smear or breast exam today.  No Known Allergies Past Medical History  Diagnosis Date  . GERD (gastroesophageal reflux disease)    Current Outpatient Prescriptions on File Prior to Visit  Medication Sig Dispense Refill  . famotidine (PEPCID) 20 MG tablet Take 1 tablet (20 mg total) by mouth 2 (two) times daily. (Patient not taking: Reported on 08/03/2014) 60 tablet 2  . ondansetron (ZOFRAN ODT) 4 MG disintegrating tablet Take 1 tablet (4 mg total) by mouth every 8 (eight) hours as needed for nausea or vomiting. (Patient not taking: Reported on 08/03/2014) 90 tablet 0   No current facility-administered medications on file prior to visit.   Family History  Problem Relation Age of Onset  . GER disease Father    Social History   Social History  . Marital Status: Single    Spouse Name: N/A  . Number of Children: N/A  . Years of Education: N/A   Occupational History  . Not on file.   Social History Main Topics  . Smoking status: Never Smoker   . Smokeless tobacco: Not on file  . Alcohol Use: No  . Drug Use: No  . Sexual Activity: Yes   Other Topics Concern  . Not on file   Social History Narrative    Review of Systems: Constitutional: Negative for fever, chills, diaphoresis, activity change, appetite change and fatigue. HENT: Negative for ear pain, nosebleeds,  congestion, facial swelling, rhinorrhea, neck pain, neck stiffness and ear discharge.  Eyes: Negative for pain, discharge, redness, itching and visual disturbance. Respiratory: Negative for cough, choking, chest tightness, shortness of breath, wheezing and stridor.  Cardiovascular: Negative for chest pain, palpitations and leg swelling. Gastrointestinal: Negative for abdominal distention. Genitourinary: Negative for dysuria, urgency, frequency, hematuria, flank pain, decreased urine volume, difficulty urinating and dyspareunia.  Musculoskeletal: Negative for back pain, joint swelling, arthralgias and gait problem. Neurological: Negative for dizziness, tremors, seizures, syncope, facial asymmetry, speech difficulty, weakness, light-headedness, numbness and headaches.  Hematological: Negative for adenopathy. Does not bruise/bleed easily. Psychiatric/Behavioral: Negative for hallucinations, behavioral problems, confusion, dysphoric mood, decreased concentration and agitation.    Objective:   Filed Vitals:   09/02/15 1621  BP: 122/81  Pulse: 90  Temp: 98 F (36.7 C)  Resp: 16    Physical Exam: Constitutional: Patient appears well-developed and well-nourished. No distress. HENT: Normocephalic, atraumatic, External right and left ear normal. Oropharynx is clear and moist.  Eyes: Conjunctivae and EOM are normal. PERRLA, no scleral icterus. Neck: Normal ROM. Neck supple. No JVD. No tracheal deviation. No thyromegaly. CVS: RRR, S1/S2 +, no murmurs, no gallops, no carotid bruit.  Pulmonary: Effort and breath sounds normal, no stridor, rhonchi, wheezes, rales.  Abdominal: Soft. BS +,  no distension, tenderness, rebound or guarding.  Musculoskeletal: Normal range of motion. No edema and no tenderness.  Lymphadenopathy: No lymphadenopathy noted, cervical  Neuro: Alert. Normal reflexes, muscle tone coordination. No cranial nerve deficit. Skin: Skin is warm and dry. No rash noted. Not diaphoretic.  No erythema. No pallor. Psychiatric: Normal mood and affect. Behavior, judgment, thought content normal.  Lab Results  Component Value Date   WBC 7.1 08/03/2014   HGB 13.8 08/03/2014   HCT 41.7 08/03/2014   MCV 88.9 08/03/2014   PLT 241 08/03/2014   Lab Results  Component Value Date   CREATININE 0.68 08/03/2014   BUN 11 08/03/2014   NA 138 08/03/2014   K 3.4* 08/03/2014   CL 105 08/03/2014   CO2 24 08/03/2014    No results found for: HGBA1C Lipid Panel     Component Value Date/Time   CHOL 188 06/09/2013 1108   TRIG 112 06/09/2013 1108   HDL 56 06/09/2013 1108   CHOLHDL 3.4 06/09/2013 1108   VLDL 22 06/09/2013 1108   LDLCALC 110* 06/09/2013 1108       Assessment and plan:   Monica Hicks was seen today for annual exam.  Diagnoses and all orders for this visit:  Annual physical exam -     COMPLETE METABOLIC PANEL WITH GFR -     CBC with Differential -     TSH  Return if symptoms worsen or fail to improve.       Lance Bosch, [facility] [phone] 09/02/2015, 4:29 PM

## 2015-09-02 NOTE — Progress Notes (Signed)
Patient here for her annual physical Patient is not doing a pap

## 2015-09-03 LAB — COMPLETE METABOLIC PANEL WITH GFR
ALBUMIN: 4.5 g/dL (ref 3.6–5.1)
ALT: 21 U/L (ref 6–29)
AST: 11 U/L (ref 10–30)
Alkaline Phosphatase: 45 U/L (ref 33–115)
BILIRUBIN TOTAL: 0.3 mg/dL (ref 0.2–1.2)
BUN: 10 mg/dL (ref 7–25)
CO2: 23 mmol/L (ref 20–31)
CREATININE: 0.63 mg/dL (ref 0.50–1.10)
Calcium: 9.5 mg/dL (ref 8.6–10.2)
Chloride: 102 mmol/L (ref 98–110)
GFR, Est Non African American: >89 mL/min (ref >=60)
Glucose, Bld: 93 mg/dL (ref 65–99)
Potassium: 4.2 mmol/L (ref 3.5–5.3)
Sodium: 135 mmol/L (ref 135–146)
TOTAL PROTEIN: 7 g/dL (ref 6.1–8.1)

## 2015-09-03 LAB — TSH: TSH: 1.94 mIU/L

## 2015-09-07 ENCOUNTER — Telehealth: Payer: Self-pay

## 2015-09-07 NOTE — Telephone Encounter (Signed)
-----   Message from Lance Bosch, NP sent at 09/06/2015 10:02 PM EDT ----- Labs are within normal limits

## 2015-09-07 NOTE — Telephone Encounter (Signed)
Interpreter line used Gwenlyn Perking [id number] Tried to call patient  Patient not available Message left on voice mail to return our call

## 2015-09-14 ENCOUNTER — Telehealth: Payer: Self-pay | Admitting: Internal Medicine

## 2015-09-14 NOTE — Telephone Encounter (Signed)
Pt. Called requesting to know if her PCP has faxed over a letter that she brought over on her OV on 09/02/15. Please f/u with pt.

## 2015-09-16 ENCOUNTER — Telehealth: Payer: Self-pay

## 2015-09-16 NOTE — Telephone Encounter (Signed)
Patient called wanting to know if lab results were faxed to the following place: [facility] Fax: [phone]

## 2015-09-16 NOTE — Telephone Encounter (Signed)
Interpreter line used Ceasar ID# [id number] Returned phone call to patient Patient was inquiring about her lab results Need to be faxed to [facility] @ [phone]

## 2016-02-15 ENCOUNTER — Emergency Department (HOSPITAL_COMMUNITY): Admission: EM | Admit: 2016-02-15 | Discharge: 2016-02-16 | Discharge status: Against Medical Advice | Payer: Self-pay

## 2016-02-15 NOTE — ED Notes (Signed)
Pt called from triage with no answer 

## 2016-02-16 ENCOUNTER — Emergency Department (HOSPITAL_COMMUNITY)
Admission: EM | Admit: 2016-02-16 | Discharge: 2016-02-16 | Disposition: A | Discharge status: Home / Self Care | Payer: Self-pay | Attending: Emergency Medicine | Admitting: Emergency Medicine

## 2016-02-16 ENCOUNTER — Emergency Department (HOSPITAL_COMMUNITY): Payer: Self-pay

## 2016-02-16 ENCOUNTER — Encounter (HOSPITAL_COMMUNITY): Payer: Self-pay | Admitting: Neurology

## 2016-02-16 DIAGNOSIS — R1013 Epigastric pain: Secondary | ICD-10-CM

## 2016-02-16 DIAGNOSIS — K2970 Gastritis, unspecified, without bleeding: Secondary | ICD-10-CM | POA: Insufficient documentation

## 2016-02-16 LAB — COMPREHENSIVE METABOLIC PANEL
ALK PHOS: 44 U/L (ref 38–126)
ALT: 20 U/L (ref 14–54)
AST: 10 U/L — ABNORMAL LOW (ref 15–41)
Albumin: 4 g/dL (ref 3.5–5.0)
Anion gap: 5 (ref 5–15)
BILIRUBIN TOTAL: 0.6 mg/dL (ref 0.3–1.2)
BUN: 12 mg/dL (ref 6–20)
CALCIUM: 9.5 mg/dL (ref 8.9–10.3)
CO2: 26 mmol/L (ref 22–32)
CREATININE: 0.69 mg/dL (ref 0.44–1.00)
Chloride: 105 mmol/L (ref 101–111)
Glucose, Bld: 99 mg/dL (ref 65–99)
Potassium: 4.2 mmol/L (ref 3.5–5.1)
SODIUM: 136 mmol/L (ref 135–145)
Total Protein: 7 g/dL (ref 6.5–8.1)

## 2016-02-16 LAB — LIPASE, BLOOD: Lipase: 23 U/L (ref 11–51)

## 2016-02-16 LAB — CBC
HCT: 39.5 % (ref 36.0–46.0)
Hemoglobin: 13.2 g/dL (ref 12.0–15.0)
MCH: 29.3 pg (ref 26.0–34.0)
MCHC: 33.4 g/dL (ref 30.0–36.0)
MCV: 87.8 fL (ref 78.0–100.0)
PLATELETS: 238 10*3/uL (ref 150–400)
RBC: 4.5 MIL/uL (ref 3.87–5.11)
RDW: 13.2 % (ref 11.5–15.5)
WBC: 6.2 10*3/uL (ref 4.0–10.5)

## 2016-02-16 LAB — I-STAT BETA HCG BLOOD, ED (MC, WL, AP ONLY)

## 2016-02-16 LAB — URINALYSIS, ROUTINE W REFLEX MICROSCOPIC
BILIRUBIN URINE: NEGATIVE
Glucose, UA: NEGATIVE mg/dL
HGB URINE DIPSTICK: NEGATIVE
Ketones, ur: NEGATIVE mg/dL
Leukocytes, UA: NEGATIVE
Nitrite: NEGATIVE
PROTEIN: NEGATIVE mg/dL
Specific Gravity, Urine: 1.02 (ref 1.005–1.030)
pH: 5.5 (ref 5.0–8.0)

## 2016-02-16 LAB — POC URINE PREG, ED: Preg Test, Ur: NEGATIVE

## 2016-02-16 MED ORDER — ONDANSETRON 4 MG PO TBDP: 4.0000 mg | ORAL_TABLET | Freq: Three times a day (TID) | ORAL | 0 refills | Status: AC | PRN

## 2016-02-16 MED ORDER — SUCRALFATE 1 G PO TABS: 1.0000 g | ORAL_TABLET | Freq: Four times a day (QID) | ORAL | 0 refills | Status: AC

## 2016-02-16 MED ORDER — OMEPRAZOLE 20 MG PO CPDR: 20.0000 mg | DELAYED_RELEASE_CAPSULE | Freq: Two times a day (BID) | ORAL | 1 refills | Status: AC

## 2016-02-16 MED ORDER — ONDANSETRON HCL 4 MG/2ML IJ SOLN
4.0000 mg | Freq: Once | INTRAMUSCULAR | Status: AC
  Administered 2016-02-16: 4 mg via INTRAVENOUS
  Filled 2016-02-16: qty 2

## 2016-02-16 MED ORDER — IOPAMIDOL (ISOVUE-300) INJECTION 61%
INTRAVENOUS | Status: AC
  Administered 2016-02-16: 75 mL via INTRAVENOUS
  Filled 2016-02-16: qty 100

## 2016-02-16 MED ORDER — MORPHINE SULFATE (PF) 4 MG/ML IV SOLN
4.0000 mg | Freq: Once | INTRAVENOUS | Status: AC
Start: 2016-02-16 — End: 2016-02-16
  Administered 2016-02-16: 4 mg via INTRAVENOUS
  Filled 2016-02-16: qty 1

## 2016-02-16 NOTE — ED Triage Notes (Addendum)
Pt here c/o RLQ & LLQ abd pain, also RUQ. Pain is intermittent. Denies n/v/d. 7/28 LMP. Denies urinary or vaginal problems.

## 2016-02-16 NOTE — Discharge Instructions (Signed)
Small frequent meals.  Avoid alcohol, tobacco, caffeine, anti-inflammatory medications.  Follow-up with your physician if not improving. You may require additional testing such as an ultrasound of your gallbladder, or an endoscopy to evaluate for possible ulcer.  Return to ER with worsening pain, uncontrolled vomiting, bloody stools, or other changes.

## 2016-02-16 NOTE — ED Notes (Signed)
Patient transported to CT 

## 2016-02-16 NOTE — ED Notes (Signed)
Warm compress placed in left upper arm.

## 2016-02-16 NOTE — ED Notes (Signed)
Resident MD at bedside for assessment. Pt is spanish speaking.

## 2016-02-16 NOTE — ED Notes (Signed)
Son at bedside, translating

## 2016-02-16 NOTE — ED Notes (Signed)
Resident MD at bedside.

## 2016-02-16 NOTE — ED Provider Notes (Signed)
[facility] DEPT Provider Note   CSN: [id number] Arrival date & time: 02/16/16  1019     History   Chief Complaint Chief Complaint  Patient presents with  . Abdominal Pain    HPI Monica Hicks is a 40 y.o. female. She presents with a complaint of abdominal pain over the last several weeks. Primary right upper quadrant somewhat diffusely to the upper abdomen. It is worse with salsa or orange juice. Does not notice any fatty food intolerance. No personal or family history of gallbladder disease. Occasional loose stool and bloated feeling. No blood in stools or diarrhea. Nausea but no vomiting.  HPI  Past Medical History:  Diagnosis Date  . GERD (gastroesophageal reflux disease)     Patient Active Problem List   Diagnosis Date Noted  . Abdominal pain, other specified site 06/09/2013  . Headache(784.0) 06/09/2013  . Unspecified constipation 06/09/2013  . Menorrhagia 06/09/2013    Past Surgical History:  Procedure Laterality Date  . CESAREAN SECTION      OB History    Gravida Para Term Preterm AB Living   4 3 3   1 3    SAB TAB Ectopic Multiple Live Births     1             Home Medications    Prior to Admission medications   Medication Sig Start Date End Date Taking? Authorizing Provider  omeprazole (PRILOSEC) 20 MG capsule Take 1 capsule (20 mg total) by mouth 2 (two) times daily. 02/16/16   Tanna Furry, MD  ondansetron (ZOFRAN ODT) 4 MG disintegrating tablet Take 1 tablet (4 mg total) by mouth every 8 (eight) hours as needed for nausea. 02/16/16   Tanna Furry, MD  sucralfate (CARAFATE) 1 g tablet Take 1 tablet (1 g total) by mouth 4 (four) times daily. 02/16/16   Tanna Furry, MD    Family History Family History  Problem Relation Age of Onset  . GER disease Father     Social History Social History  Substance Use Topics  . Smoking status: Never Smoker  . Smokeless tobacco: Not on file  . Alcohol use No     Allergies   Review of patient's  allergies indicates no known allergies.   Review of Systems Review of Systems  Constitutional: Negative for appetite change, chills, diaphoresis, fatigue and fever.  HENT: Negative for mouth sores, sore throat and trouble swallowing.   Eyes: Negative for visual disturbance.  Respiratory: Negative for cough, chest tightness, shortness of breath and wheezing.   Cardiovascular: Negative for chest pain.  Gastrointestinal: Positive for abdominal pain and nausea. Negative for abdominal distention, diarrhea and vomiting.  Endocrine: Negative for polydipsia, polyphagia and polyuria.  Genitourinary: Negative for dysuria, frequency and hematuria.  Musculoskeletal: Negative for gait problem.  Skin: Negative for color change, pallor and rash.  Neurological: Negative for dizziness, syncope, light-headedness and headaches.  Hematological: Does not bruise/bleed easily.  Psychiatric/Behavioral: Negative for behavioral problems and confusion.     Physical Exam Updated Vital Signs BP 123/75 (BP Location: Right Arm)   Pulse 74   Temp 97.9 F (36.6 C) (Oral)   Resp 16   LMP 01/21/2016   SpO2 98%   Physical Exam  Constitutional: She is oriented to person, place, and time. She appears well-developed and well-nourished. No distress.  HENT:  Head: Normocephalic.  Eyes: Conjunctivae are normal. Pupils are equal, round, and reactive to light. No scleral icterus.  Neck: Normal range of motion. Neck supple. No thyromegaly present.  Cardiovascular: Normal rate and regular rhythm.  Exam reveals no gallop and no friction rub.   No murmur heard. Pulmonary/Chest: Effort normal and breath sounds normal. No respiratory distress. She has no wheezes. She has no rales.  Abdominal: Soft. Bowel sounds are normal. She exhibits no distension. There is no tenderness. There is no rebound.  Tenderness in the epigastrium without guarding or rebound.  Musculoskeletal: Normal range of motion.  Neurological: She is alert  and oriented to person, place, and time.  Skin: Skin is warm and dry. No rash noted.  Psychiatric: She has a normal mood and affect. Her behavior is normal.     ED Treatments / Results  Labs (all labs ordered are listed, but only abnormal results are displayed) Labs Reviewed  COMPREHENSIVE METABOLIC PANEL - Abnormal; Notable for the following:       Result Value   AST 10 (*)    All other components within normal limits  LIPASE, BLOOD  CBC  URINALYSIS, ROUTINE W REFLEX MICROSCOPIC (NOT AT [facility])  I-STAT BETA HCG BLOOD, ED ([facility], [facility], [facility] ONLY)  POC URINE PREG, ED    EKG  EKG Interpretation None       Radiology Ct Abdomen Pelvis W Contrast  Result Date: 02/16/2016 CLINICAL DATA:  Left flank pain for 2 weeks. EXAM: CT ABDOMEN AND PELVIS WITH CONTRAST TECHNIQUE: Multidetector CT imaging of the abdomen and pelvis was performed using the standard protocol following bolus administration of intravenous contrast. CONTRAST:  75 mL of Isovue-300 intravenously. COMPARISON:  None. FINDINGS: Visualized lung bases are unremarkable. No significant osseous abnormality is noted. No gallstones are noted. The liver, spleen and pancreas are unremarkable. Adrenal glands and kidneys appear normal. No hydronephrosis or renal obstruction is noted. No renal or ureteral calculi are noted. The appendix appears normal. There is no evidence of bowel obstruction. No abnormal fluid collection is noted. Urinary bladder appears normal. Uterus and right ovary are unremarkable. 2.7 cm peripherally enhancing cyst is noted in left ovary with another 3 cm simple cyst seen in left ovary. No significant adenopathy is noted. IMPRESSION: Two cysts are noted in the left ovary, with the largest measuring 3 cm. No other abnormality is noted in the abdomen. Electronically Signed   By: Marijo Conception, M.D.   On: 02/16/2016 14:27    Procedures Procedures (including critical care time)  Medications Ordered in ED Medications    iopamidol (ISOVUE-300) 61 % injection (75 mLs Intravenous Contrast Given 02/16/16 1343)  ondansetron (ZOFRAN) injection 4 mg (4 mg Intravenous Given 02/16/16 1430)  morphine 4 MG/ML injection 4 mg (4 mg Intravenous Given 02/16/16 1430)     Initial Impression / Assessment and Plan / ED Course  I have reviewed the triage vital signs and the nursing notes.  Pertinent labs & imaging results that were available during my care of the patient were reviewed by me and considered in my medical decision making (see chart for details).  Clinical Course    CT is negative. Hepatobiliary enzymes and lipase normal. No leukocytosis. Plan is discharge home. Treatment proton pump inhibitors, Carafate, Zofran, GI follow-up if not improving.  Final Clinical Impressions(s) / ED Diagnoses   Final diagnoses:  Epigastric pain  Gastritis    New Prescriptions Discharge Medication List as of 02/16/2016  3:49 PM    START taking these medications   Details  omeprazole (PRILOSEC) 20 MG capsule Take 1 capsule (20 mg total) by mouth 2 (two) times daily., Starting Wed 02/16/2016, Print  sucralfate (CARAFATE) 1 g tablet Take 1 tablet (1 g total) by mouth 4 (four) times daily., Starting Wed 02/16/2016, Print         Tanna Furry, MD 02/16/16 [phone]

## 2016-10-12 ENCOUNTER — Emergency Department (HOSPITAL_COMMUNITY)
Admission: EM | Admit: 2016-10-12 | Discharge: 2016-10-13 | Disposition: A | Discharge status: Home / Self Care | Payer: Self-pay | Attending: Emergency Medicine | Admitting: Emergency Medicine

## 2016-10-12 ENCOUNTER — Encounter (HOSPITAL_COMMUNITY): Payer: Self-pay | Admitting: *Deleted

## 2016-10-12 ENCOUNTER — Emergency Department (HOSPITAL_COMMUNITY): Payer: Self-pay

## 2016-10-12 DIAGNOSIS — Z79899 Other long term (current) drug therapy: Secondary | ICD-10-CM | POA: Insufficient documentation

## 2016-10-12 DIAGNOSIS — M545 Low back pain, unspecified: Secondary | ICD-10-CM

## 2016-10-12 DIAGNOSIS — R109 Unspecified abdominal pain: Secondary | ICD-10-CM

## 2016-10-12 DIAGNOSIS — R1032 Left lower quadrant pain: Secondary | ICD-10-CM | POA: Insufficient documentation

## 2016-10-12 LAB — CBC WITH DIFFERENTIAL/PLATELET
BASOS ABS: 0.1 10*3/uL (ref 0.0–0.1)
Basophils Relative: 1 %
Eosinophils Absolute: 0.2 10*3/uL (ref 0.0–0.7)
Eosinophils Relative: 2 %
HEMATOCRIT: 38.8 % (ref 36.0–46.0)
HEMOGLOBIN: 12.9 g/dL (ref 12.0–15.0)
LYMPHS PCT: 32 %
Lymphs Abs: 2.7 10*3/uL (ref 0.7–4.0)
MCH: 29.1 pg (ref 26.0–34.0)
MCHC: 33.2 g/dL (ref 30.0–36.0)
MCV: 87.6 fL (ref 78.0–100.0)
Monocytes Absolute: 0.8 10*3/uL (ref 0.1–1.0)
Monocytes Relative: 9 %
NEUTROS ABS: 4.8 10*3/uL (ref 1.7–7.7)
NEUTROS PCT: 56 %
PLATELETS: 253 10*3/uL (ref 150–400)
RBC: 4.43 MIL/uL (ref 3.87–5.11)
RDW: 13 % (ref 11.5–15.5)
WBC: 8.4 10*3/uL (ref 4.0–10.5)

## 2016-10-12 LAB — COMPREHENSIVE METABOLIC PANEL
ALT: 26 U/L (ref 14–54)
AST: 15 U/L (ref 15–41)
Albumin: 4.1 g/dL (ref 3.5–5.0)
Alkaline Phosphatase: 48 U/L (ref 38–126)
Anion gap: 9 (ref 5–15)
BILIRUBIN TOTAL: 0.4 mg/dL (ref 0.3–1.2)
BUN: 8 mg/dL (ref 6–20)
CHLORIDE: 105 mmol/L (ref 101–111)
CO2: 24 mmol/L (ref 22–32)
CREATININE: 0.68 mg/dL (ref 0.44–1.00)
Calcium: 9.4 mg/dL (ref 8.9–10.3)
GFR calc Af Amer: >60 mL/min (ref >60)
Glucose, Bld: 99 mg/dL (ref 65–99)
Potassium: 3.9 mmol/L (ref 3.5–5.1)
Sodium: 138 mmol/L (ref 135–145)
Total Protein: 7.1 g/dL (ref 6.5–8.1)

## 2016-10-12 LAB — PREGNANCY, URINE: Preg Test, Ur: NEGATIVE

## 2016-10-12 LAB — URINALYSIS, ROUTINE W REFLEX MICROSCOPIC
Bilirubin Urine: NEGATIVE
Glucose, UA: NEGATIVE mg/dL
Hgb urine dipstick: NEGATIVE
Ketones, ur: NEGATIVE mg/dL
LEUKOCYTES UA: NEGATIVE
Nitrite: NEGATIVE
PROTEIN: NEGATIVE mg/dL
Specific Gravity, Urine: 1.008 (ref 1.005–1.030)
pH: 6 (ref 5.0–8.0)

## 2016-10-12 LAB — I-STAT CG4 LACTIC ACID, ED: LACTIC ACID, VENOUS: 1.31 mmol/L (ref 0.5–1.9)

## 2016-10-12 LAB — LIPASE, BLOOD: Lipase: 20 U/L (ref 11–51)

## 2016-10-12 MED ORDER — SODIUM CHLORIDE 0.9 % IV BOLUS (SEPSIS)
1000.0000 mL | Freq: Once | INTRAVENOUS | Status: AC
  Administered 2016-10-12: 1000 mL via INTRAVENOUS

## 2016-10-12 MED ORDER — IOPAMIDOL (ISOVUE-300) INJECTION 61%
INTRAVENOUS | Status: AC
  Administered 2016-10-12: 100 mL
  Filled 2016-10-12: qty 100

## 2016-10-12 MED ORDER — MORPHINE SULFATE (PF) 4 MG/ML IV SOLN
4.0000 mg | Freq: Once | INTRAVENOUS | Status: AC
  Administered 2016-10-12: 4 mg via INTRAVENOUS
  Filled 2016-10-12: qty 1

## 2016-10-12 MED ORDER — ONDANSETRON HCL 4 MG PO TABS: 4.0000 mg | ORAL_TABLET | Freq: Three times a day (TID) | ORAL | 0 refills | Status: AC | PRN

## 2016-10-12 MED ORDER — HYDROCODONE-ACETAMINOPHEN 5-325 MG PO TABS: 1.0000 | ORAL_TABLET | ORAL | 0 refills | Status: AC | PRN

## 2016-10-12 NOTE — ED Provider Notes (Signed)
[facility] DEPT Provider Note   CSN: [id number] Arrival date & time: 10/12/16  1757     History   Chief Complaint Chief Complaint  Patient presents with  . Back Pain    HPI Monica Hicks is a 41 y.o. female.  The history is provided by the patient. The history is limited by a language barrier. A language interpreter was used.  Flank Pain  This is a new problem. The current episode started more than 1 week ago. The problem occurs constantly (2 weeks). The problem has not changed since onset.Associated symptoms include abdominal pain. Pertinent negatives include no chest pain, no headaches and no shortness of breath. The symptoms are aggravated by twisting. Nothing relieves the symptoms. She has tried nothing for the symptoms. The treatment provided no relief.    Past Medical History:  Diagnosis Date  . GERD (gastroesophageal reflux disease)     Patient Active Problem List   Diagnosis Date Noted  . Abdominal pain, other specified site 06/09/2013  . Headache(784.0) 06/09/2013  . Unspecified constipation 06/09/2013  . Menorrhagia 06/09/2013    Past Surgical History:  Procedure Laterality Date  . CESAREAN SECTION      OB History    Gravida Para Term Preterm AB Living   4 3 3   1 3    SAB TAB Ectopic Multiple Live Births     1             Home Medications    Prior to Admission medications   Medication Sig Start Date End Date Taking? Authorizing Provider  omeprazole (PRILOSEC) 20 MG capsule Take 1 capsule (20 mg total) by mouth 2 (two) times daily. 02/16/16   Tanna Furry, MD  ondansetron (ZOFRAN ODT) 4 MG disintegrating tablet Take 1 tablet (4 mg total) by mouth every 8 (eight) hours as needed for nausea. 02/16/16   Tanna Furry, MD  sucralfate (CARAFATE) 1 g tablet Take 1 tablet (1 g total) by mouth 4 (four) times daily. 02/16/16   Tanna Furry, MD    Family History Family History  Problem Relation Age of Onset  . GER disease Father     Social  History Social History  Substance Use Topics  . Smoking status: Never Smoker  . Smokeless tobacco: Not on file  . Alcohol use No     Allergies   Patient has no known allergies.   Review of Systems Review of Systems  Constitutional: Negative for activity change, appetite change, chills, diaphoresis, fatigue and fever.  HENT: Negative for congestion, rhinorrhea and sinus pressure.   Eyes: Negative for visual disturbance.  Respiratory: Negative for cough, chest tightness, shortness of breath and stridor.   Cardiovascular: Negative for chest pain, palpitations and leg swelling.  Gastrointestinal: Positive for abdominal pain. Negative for abdominal distention, constipation, diarrhea, nausea and vomiting.  Genitourinary: Positive for flank pain. Negative for difficulty urinating, dysuria, frequency, hematuria, menstrual problem, pelvic pain, urgency, vaginal bleeding and vaginal discharge.  Musculoskeletal: Positive for back pain. Negative for neck pain.  Skin: Negative for rash and wound.  Neurological: Negative for dizziness, weakness, light-headedness, numbness and headaches.  Psychiatric/Behavioral: Negative for agitation and confusion.  All other systems reviewed and are negative.    Physical Exam Updated Vital Signs BP (!) 131/104 (BP Location: Right Arm)   Pulse 83   Temp 97.9 F (36.6 C) (Oral)   Resp 17   Ht 5\' 4"  (1.626 m)   SpO2 99%   Physical Exam  Constitutional: She is oriented to  person, place, and time. She appears well-developed and well-nourished. No distress.  HENT:  Head: Normocephalic and atraumatic.  Right Ear: External ear normal.  Left Ear: External ear normal.  Nose: Nose normal.  Mouth/Throat: Oropharynx is clear and moist. No oropharyngeal exudate.  Eyes: Conjunctivae and EOM are normal. Pupils are equal, round, and reactive to light.  Neck: Normal range of motion. Neck supple.  Cardiovascular: Normal rate, normal heart sounds and intact distal  pulses.   No murmur heard. Pulmonary/Chest: Effort normal and breath sounds normal. No stridor. No respiratory distress. She has no wheezes. She exhibits no tenderness.  Abdominal: Normal appearance. She exhibits no distension. There is tenderness. There is no rigidity, no rebound and no CVA tenderness.    Musculoskeletal: She exhibits tenderness. She exhibits no edema or deformity.       Thoracic back: She exhibits tenderness and pain. She exhibits no deformity and no laceration.       Back:  Neurological: She is alert and oriented to person, place, and time. She has normal reflexes. No sensory deficit. She exhibits normal muscle tone.  Skin: Skin is warm. Capillary refill takes less than 2 seconds. No rash noted. She is not diaphoretic. No erythema.  Psychiatric: She has a normal mood and affect.  Nursing note and vitals reviewed.    ED Treatments / Results  Labs (all labs ordered are listed, but only abnormal results are displayed) Labs Reviewed  URINALYSIS, ROUTINE W REFLEX MICROSCOPIC - Abnormal; Notable for the following:       Result Value   Color, Urine STRAW (*)    All other components within normal limits  CBC WITH DIFFERENTIAL/PLATELET  COMPREHENSIVE METABOLIC PANEL  LIPASE, BLOOD  PREGNANCY, URINE  POC URINE PREG, ED  I-STAT CG4 LACTIC ACID, ED    EKG  EKG Interpretation None       Radiology Ct Abdomen Pelvis W Contrast  Result Date: 10/12/2016 CLINICAL DATA:  Subacute onset of left-sided abdominal pain. Initial encounter. EXAM: CT ABDOMEN AND PELVIS WITH CONTRAST TECHNIQUE: Multidetector CT imaging of the abdomen and pelvis was performed using the standard protocol following bolus administration of intravenous contrast. CONTRAST:  19mL ISOVUE-300 IOPAMIDOL (ISOVUE-300) INJECTION 61% COMPARISON:  CT of the abdomen and pelvis from 02/16/2016 FINDINGS: Lower chest: Minimal bibasilar atelectasis is noted. The visualized portions of the mediastinum are  unremarkable. Hepatobiliary: The liver is unremarkable in appearance. The gallbladder is unremarkable in appearance. The common bile duct remains normal in caliber. Pancreas: The pancreas is within normal limits. Spleen: The spleen is unremarkable in appearance. Adrenals/Urinary Tract: The adrenal glands are unremarkable in appearance. The kidneys are within normal limits. There is no evidence of hydronephrosis. No renal or ureteral stones are identified. No perinephric stranding is seen. Stomach/Bowel: The stomach is unremarkable in appearance. The small bowel is within normal limits. The appendix is normal in caliber, without evidence of appendicitis. The colon is unremarkable in appearance. Vascular/Lymphatic: The abdominal aorta is unremarkable in appearance. The inferior vena cava is grossly unremarkable. No retroperitoneal lymphadenopathy is seen. No pelvic sidewall lymphadenopathy is identified. Reproductive: The bladder is mildly distended and grossly unremarkable. The uterus is grossly unremarkable in appearance. Heterogeneity at the cervix is nonspecific. No suspicious adnexal masses are seen. The ovaries are grossly symmetric. Other: No additional soft tissue abnormalities are seen. Chronic nodular soft tissue changes within the flanks are grossly stable from 2017 and may reflect the patient's baseline. Would correlate for any underlying systemic condition. Musculoskeletal: No acute osseous  abnormalities are identified. The visualized musculature is unremarkable in appearance. IMPRESSION: 1. No acute abnormality seen to explain the patient's symptoms. 2. Nonspecific heterogeneity at the uterine cervix. Would correlate with Pap smear results, to exclude underlying mass. 3. Chronic nodular soft tissue changes within both flanks are grossly stable from 2017 and may reflect the patient's baseline. Would correlate for any underlying systemic condition. Electronically Signed   By: Garald Balding M.D.   On:  10/12/2016 21:51    Procedures Procedures (including critical care time)  Medications Ordered in ED Medications  morphine 4 MG/ML injection 4 mg (4 mg Intravenous Given 10/12/16 1934)  sodium chloride 0.9 % bolus 1,000 mL (0 mLs Intravenous Stopped 10/12/16 2332)  iopamidol (ISOVUE-300) 61 % injection (100 mLs  Contrast Given 10/12/16 2051)  morphine 4 MG/ML injection 4 mg (4 mg Intravenous Given 10/12/16 2324)     Initial Impression / Assessment and Plan / ED Course  I have reviewed the triage vital signs and the nursing notes.  Pertinent labs & imaging results that were available during my care of the patient were reviewed by me and considered in my medical decision making (see chart for details).     Monica Hicks is a 41 y.o. female  With a history of GERD who presents with left flank pain. Patient reports that she has had left sided pain for the last two weeks but worsening over the last few days. Patient says it is painful when she twists or turns or presses on her side. She denies any nausea/vomiting. She thought her urine might have smelled bad the other day but denies any vaginal discharge or vaginal bleeding. Patient describes the pain as moderate to severe. She denies fevers or chills. She denied rhinorrhea, congestion, cough, chest pain, or shortness of breath. She denies any sick contacts. She does report feeling slightly fatigued.  On exam, patient had left flank tenderness and lateral left upper quadrant tenderness. No CVA tenderness. No lower abdominal tenderness. Exam otherwise unremarkable.  Given location of discomfort, initial etiologies were considered as GERD, kidney stone, pyelonephritis, pancreatitis, and muscular pain.  Diagnostic workup was grossly reassuring. CT scan should know pyelonephritis, obstruction, nephrolithiasis, or other intra-abdominal pathology causing the pain. Patient cervix was heterogeneous and she was instructed to follow up with her  OB/GYN for a Pap smear. Patient had no pelvic or lower abdominal pain  tonight. Did not feel patient needed a pelvic exam given her lack of pelvic symptoms.  Lab testing showed no evidence of infection, or organ abnormality.  Patient a much better after pain medications.  Given reassuring workup, patient be discharged with pain medicine nausea medicine. Patient will follow up with PCP for further management of symptoms. Suspect musculoskeletal pain given the location and distribution of tenderness. Patient understood strict return precautions and her follow up instructions. Patient had no other questions or concerns and patient was discharged in good condition.  A physical Spanish interpreter was used for all interactions with patient and family.   Final Clinical Impressions(s) / ED Diagnoses   Final diagnoses:  Acute left-sided low back pain without sciatica  Left flank pain    New Prescriptions Discharge Medication List as of 10/12/2016 11:41 PM    START taking these medications   Details  HYDROcodone-acetaminophen (NORCO/VICODIN) 5-325 MG tablet Take 1 tablet by mouth every 4 (four) hours as needed., Starting Thu 10/12/2016, Print    ondansetron (ZOFRAN) 4 MG tablet Take 1 tablet (4 mg total) by mouth every  8 (eight) hours as needed for nausea or vomiting., Starting Thu 10/12/2016, Print        Clinical Impression: 1. Acute left-sided low back pain without sciatica   2. Left flank pain     Disposition: Discharge  Condition: Good  I have discussed the results, Dx and Tx plan with the pt(& family if present). He/she/they expressed understanding and agree(s) with the plan. Discharge instructions discussed at great length. Strict return precautions discussed and pt &/or family have verbalized understanding of the instructions. No further questions at time of discharge.    Discharge Medication List as of 10/12/2016 11:41 PM    START taking these medications   Details   HYDROcodone-acetaminophen (NORCO/VICODIN) 5-325 MG tablet Take 1 tablet by mouth every 4 (four) hours as needed., Starting Thu 10/12/2016, Print    ondansetron (ZOFRAN) 4 MG tablet Take 1 tablet (4 mg total) by mouth every 8 (eight) hours as needed for nausea or vomiting., Starting Thu 10/12/2016, Print        Follow Up: [facility] [address] [place] [id number] [phone] Schedule an appointment as soon as possible for a visit    [place] [phone] Schedule an appointment as soon as possible for a visit    [place] [address] [id number] [place] [phone]  If symptoms worsen     Courtney Paris, MD 10/13/16 1431

## 2016-10-12 NOTE — ED Triage Notes (Signed)
Pt reports left side lower back pain x 2 days and is radiating around to her front. Reports fever yesterday and nausea today. No vomiting.

## 2016-10-12 NOTE — Discharge Instructions (Signed)
Please take the pain medicine and nausea medicine for further management of your symptoms. We did not find evidence of acute injury or infection tonight. Your CT scan recommended to get a Pap smear by OB/GYN. Please schedule this. If any symptoms change or worsen, please return to the nearest emergency department.

## 2016-10-12 NOTE — ED Notes (Signed)
Patient taken to CT.

## 2016-10-13 NOTE — ED Notes (Signed)
Patient Alert and oriented X4. Stable and ambulatory. Patient verbalized understanding of the discharge instructions.  Patient belongings were taken by the patient.  

## 2016-11-08 ENCOUNTER — Encounter: Payer: Self-pay | Admitting: Obstetrics & Gynecology

## 2016-11-08 ENCOUNTER — Telehealth: Payer: Self-pay | Admitting: Obstetrics & Gynecology

## 2016-11-08 ENCOUNTER — Ambulatory Visit (INDEPENDENT_AMBULATORY_CARE_PROVIDER_SITE_OTHER): Payer: Self-pay | Admitting: Obstetrics & Gynecology

## 2016-11-08 VITALS — BP 129/83 | HR 94 | Wt 179.1 lb

## 2016-11-08 DIAGNOSIS — R102 Pelvic and perineal pain: Secondary | ICD-10-CM

## 2016-11-08 DIAGNOSIS — Z113 Encounter for screening for infections with a predominantly sexual mode of transmission: Secondary | ICD-10-CM

## 2016-11-08 NOTE — Progress Notes (Signed)
Patient ID: Monica Hicks, female   DOB: February 22, 1976, 41 y.o.   MRN: [id number]  Chief Complaint  Patient presents with  . Pelvic Pain    HPI [name] is a 41 y.o. female.  [id number] LMP 10/25/16. She was seen in ED 4 weeks ago for left flank pain and she was referred here because of CT findings with heterogeneous cervix. Pap 06/2013 was negative. She has not conceived since 2009 with no contraception HPI  Past Medical History:  Diagnosis Date  . GERD (gastroesophageal reflux disease)     Past Surgical History:  Procedure Laterality Date  . CESAREAN SECTION      Family History  Problem Relation Age of Onset  . GER disease Father     Social History Social History  Substance Use Topics  . Smoking status: Never Smoker  . Smokeless tobacco: Not on file  . Alcohol use No    No Known Allergies  Current Outpatient Prescriptions  Medication Sig Dispense Refill  . HYDROcodone-acetaminophen (NORCO/VICODIN) 5-325 MG tablet Take 1 tablet by mouth every 4 (four) hours as needed. (Patient not taking: Reported on 11/08/2016) 10 tablet 0  . omeprazole (PRILOSEC) 20 MG capsule Take 1 capsule (20 mg total) by mouth 2 (two) times daily. (Patient not taking: Reported on 11/08/2016) 60 capsule 1  . ondansetron (ZOFRAN ODT) 4 MG disintegrating tablet Take 1 tablet (4 mg total) by mouth every 8 (eight) hours as needed for nausea. (Patient not taking: Reported on 11/08/2016) 6 tablet 0  . ondansetron (ZOFRAN) 4 MG tablet Take 1 tablet (4 mg total) by mouth every 8 (eight) hours as needed for nausea or vomiting. (Patient not taking: Reported on 11/08/2016) 12 tablet 0  . sucralfate (CARAFATE) 1 g tablet Take 1 tablet (1 g total) by mouth 4 (four) times daily. (Patient not taking: Reported on 11/08/2016) 60 tablet 0   No current facility-administered medications for this visit.     Review of Systems Review of Systems  Constitutional: Negative.   Respiratory: Negative.    Gastrointestinal: Negative.   Genitourinary: Positive for dysuria, flank pain, frequency, pelvic pain and urgency. Negative for difficulty urinating, menstrual problem, vaginal bleeding and vaginal discharge.    Blood pressure 129/83, pulse 94, weight 179 lb 1.6 oz (81.2 kg).  Physical Exam Physical Exam  Constitutional: She is oriented to person, place, and time. She appears well-developed. No distress.  Cardiovascular: Normal rate.   Pulmonary/Chest: Effort normal. No respiratory distress.  Abdominal: Soft. There is tenderness (mild lower quadrants).  Genitourinary: Vagina normal. No vaginal discharge found.  Genitourinary Comments: No masses, diffuse s/p tenderness soft  Neurological: She is alert and oriented to person, place, and time.  Psychiatric: She has a normal mood and affect. Her behavior is normal.  Vitals reviewed.   Data Reviewed CLINICAL DATA:  Subacute onset of left-sided abdominal pain. Initial encounter.  EXAM: CT ABDOMEN AND PELVIS WITH CONTRAST  TECHNIQUE: Multidetector CT imaging of the abdomen and pelvis was performed using the standard protocol following bolus administration of intravenous contrast.  CONTRAST:  148mL ISOVUE-300 IOPAMIDOL (ISOVUE-300) INJECTION 61%  COMPARISON:  CT of the abdomen and pelvis from 02/16/2016  FINDINGS: Lower chest: Minimal bibasilar atelectasis is noted. The visualized portions of the mediastinum are unremarkable.  Hepatobiliary: The liver is unremarkable in appearance. The gallbladder is unremarkable in appearance. The common bile duct remains normal in caliber.  Pancreas: The pancreas is within normal limits.  Spleen: The spleen is unremarkable in appearance.  Adrenals/Urinary Tract:  The adrenal glands are unremarkable in appearance. The kidneys are within normal limits. There is no evidence of hydronephrosis. No renal or ureteral stones are identified. No perinephric stranding is  seen.  Stomach/Bowel: The stomach is unremarkable in appearance. The small bowel is within normal limits. The appendix is normal in caliber, without evidence of appendicitis. The colon is unremarkable in appearance.  Vascular/Lymphatic: The abdominal aorta is unremarkable in appearance. The inferior vena cava is grossly unremarkable. No retroperitoneal lymphadenopathy is seen. No pelvic sidewall lymphadenopathy is identified.  Reproductive: The bladder is mildly distended and grossly unremarkable. The uterus is grossly unremarkable in appearance. Heterogeneity at the cervix is nonspecific. No suspicious adnexal masses are seen. The ovaries are grossly symmetric.  Other: No additional soft tissue abnormalities are seen. Chronic nodular soft tissue changes within the flanks are grossly stable from 2017 and may reflect the patient's baseline. Would correlate for any underlying systemic condition.  Musculoskeletal: No acute osseous abnormalities are identified. The visualized musculature is unremarkable in appearance.  IMPRESSION: 1. No acute abnormality seen to explain the patient's symptoms. 2. Nonspecific heterogeneity at the uterine cervix. Would correlate with Pap smear results, to exclude underlying mass. 3. Chronic nodular soft tissue changes within both flanks are grossly stable from 2017 and may reflect the patient's baseline. Would correlate for any underlying systemic condition.   Electronically Signed   By: Garald Balding M.D.   On: 10/12/2016 21:51  Assessment    Chronic pelvic pain with negative laparoscopy in [facility] recently Urinary sx suspicious for interstitial cystitis Secondary infertility h/o chlamydia 2013  Plan    Pelvic US to f/u CT findings RTC 4 weeks May need urology referral       Emeterio Reeve 11/08/2016, 2:55 PM

## 2016-11-08 NOTE — Telephone Encounter (Signed)
Called patient due leaving visit today, without checking out. Scheduled 4 week follow up and informed patient that she would need to come back to fill out release for surgery notes done in [place].

## 2016-11-08 NOTE — Patient Instructions (Signed)
Dolor plvico en la mujer (Pelvic Pain, Female) El dolor plvico se percibe en la parte baja del abdomen, por debajo del ombligo y [employer]. El dolor puede comenzar de forma repentina (agudo), reaparecer (recurrente) o durar mucho tiempo (crnico). Se considera que el dolor plvico que dura ms de seis meses es crnico. El dolor plvico puede tener numerosas causas. A veces, la causa no se conoce. CUIDADOS EN EL HOGAR  Tome los medicamentos de venta libre y los recetados solamente como se lo haya indicado el mdico.  Haga reposo como se lo haya indicado el mdico.  No tenga relaciones sexuales si le causan dolor.  Lleve un registro del dolor plvico. Escriba los siguientes datos:  Cundo comenz Conservation officer, historic buildings.  La ubicacin del dolor.  Qu cosas parecen Fish farm manager, como los alimentos o la [place].  Los sntomas que tiene junto con Conservation officer, historic buildings.  Concurra a todas las visitas de control como se lo haya indicado el mdico. Esto es importante. SOLICITE AYUDA SI:  Los medicamentos no Forensic psychologist.  El dolor reaparece.  Aparecen nuevos sntomas.  Tiene secrecin o sangrado vaginal atpico.  Tiene fiebre o siente escalofros.  Tiene dificultad para defecar (estreimiento).  Observa sangre en la orina o en la materia fecal.  La orina tiene mal olor.  Se siente mareada o dbil. SOLICITE AYUDA DE INMEDIATO SI:  Siente un dolor repentino que es muy intenso.  El dolor contina empeorando.  El dolor es muy intenso y, [place], tiene alguno de los siguientes sntomas:  Cristy Hilts.  Ganas de vomitar (nuseas).  Vmitos.  Santo Held.  Se desmaya (pierde el conocimiento). Esta informacin no tiene Marine scientist el consejo del mdico. Asegrese de hacerle al mdico cualquier pregunta que tenga. Document Released: 12/12/2011 Document Revised: 07/03/2014 Document Reviewed: 04/02/2015 Elsevier Interactive Patient Education  2017 [employer].

## 2016-11-08 NOTE — Progress Notes (Signed)
Spanish interpreter "Gio" [phone] used for visit

## 2016-11-09 LAB — CERVICOVAGINAL ANCILLARY ONLY
CHLAMYDIA, DNA PROBE: NEGATIVE
Neisseria Gonorrhea: NEGATIVE

## 2016-11-15 ENCOUNTER — Ambulatory Visit (HOSPITAL_COMMUNITY)
Admission: RE | Admit: 2016-11-15 | Discharge: 2016-11-15 | Disposition: A | Discharge status: Home / Self Care | Payer: Self-pay | Source: Ambulatory Visit | Attending: Obstetrics & Gynecology | Admitting: Obstetrics & Gynecology

## 2016-11-15 DIAGNOSIS — D252 Subserosal leiomyoma of uterus: Secondary | ICD-10-CM | POA: Insufficient documentation

## 2016-11-15 DIAGNOSIS — R102 Pelvic and perineal pain: Secondary | ICD-10-CM | POA: Insufficient documentation

## 2016-11-22 ENCOUNTER — Telehealth: Payer: Self-pay | Admitting: General Practice

## 2016-11-22 NOTE — Telephone Encounter (Signed)
Called patient regarding ultrasound results with pacific interpreter [phone], no answer- left message to call us back regarding non urgent results. Will send letter.

## 2016-11-22 NOTE — Telephone Encounter (Signed)
-----   Message from Woodroe Mode, MD sent at 11/16/2016 10:29 AM EDT ----- Normal [place] except small 2.9 cm fibroid

## 2016-11-22 NOTE — Telephone Encounter (Signed)
Patient called back into front office returning my call. Called patient with pacific interpreter [phone] and informed her of ultrasound results & follow up appt in our office. Patient verbalized understanding & asked if she needed medication. Told patient she does not need medication and explained what fibroids were. Patient verbalized understanding & had no other questions

## 2016-12-11 ENCOUNTER — Ambulatory Visit (INDEPENDENT_AMBULATORY_CARE_PROVIDER_SITE_OTHER): Payer: Self-pay | Admitting: Obstetrics & Gynecology

## 2016-12-11 ENCOUNTER — Encounter: Payer: Self-pay | Admitting: Obstetrics & Gynecology

## 2016-12-11 VITALS — BP 132/88 | HR 84 | Wt 178.5 lb

## 2016-12-11 DIAGNOSIS — R102 Pelvic and perineal pain: Secondary | ICD-10-CM

## 2016-12-11 NOTE — Progress Notes (Signed)
Spanish video interpreter "Brayton Layman" [phone] used for visit

## 2016-12-11 NOTE — Progress Notes (Signed)
Per Dr. Roselie Awkward needs Urology referral . Explained to patient since she is self pay will need to take money with to appt - at least $250 and they will discuss remaining payment at the visit . Also discussed [facility] in [facility], she chose [facility]. Made appointment with [facility] for first available appointment: 01/29/17 10:00.  Given to patient.

## 2016-12-11 NOTE — Progress Notes (Signed)
Subjective:     Patient ID: Monica Hicks, female   DOB: 1975-11-21, 41 y.o.   MRN: [id number] pain HPI [id number] No LMP recorded (lmp unknown). Still has daily pain unrelated to menses with dysuria and frequency, dyspareunia. [place] result reviewed.  Review of Systems  Constitutional: Negative.   Gastrointestinal: Negative.   Genitourinary: Positive for dysuria, frequency and pelvic pain. Negative for menstrual problem, vaginal bleeding and vaginal discharge.       Objective:   Physical Exam  Constitutional: She is oriented to person, place, and time. She appears well-developed. No distress.  Cardiovascular: Normal rate.   Pulmonary/Chest: She is in respiratory distress.  Neurological: She is alert and oriented to person, place, and time.  Psychiatric: She has a normal mood and affect. Her behavior is normal.      CLINICAL DATA:  Pelvic pain  EXAM: TRANSABDOMINAL AND TRANSVAGINAL ULTRASOUND OF PELVIS  TECHNIQUE: Both transabdominal and transvaginal ultrasound examinations of the pelvis were performed. Transabdominal technique was performed for global imaging of the pelvis including uterus, ovaries, adnexal regions, and pelvic cul-de-sac. It was necessary to proceed with endovaginal exam following the transabdominal exam to visualize the endometrium.  COMPARISON:  CT abdomen/pelvis dated 10/12/2016  FINDINGS: Uterus  Measurements: 7.4 x 4.6 x 5.2 cm. 2.9 x 2.9 x 2.2 cm subserosal fibroid in the anterior uterine fundus.  Endometrium  Thickness: 6 mm.  No focal abnormality visualized.  Right ovary  Measurements: 4.0 x 2.6 x 2.8 cm. Normal appearance/no adnexal mass.  Left ovary  Measurements: 2.9 x 1.9 x 2.4 cm. Normal appearance/no adnexal mass.  Other findings  No abnormal free fluid.  IMPRESSION: 2.9 cm subserosal fibroid in the anterior uterine fundus.   Electronically Signed   By: Julian Hy M.D.   On: 11/15/2016  16:09 Assessment:     Patient Active Problem List   Diagnosis Date Noted  . Pelvic pain in female 11/08/2016  . Abdominal pain, other specified site 06/09/2013  . Headache(784.0) 06/09/2013  . Unspecified constipation 06/09/2013  . Menorrhagia 06/09/2013   At risk for IC based on her Sx and negative laparoscopy    Plan:     Referral to [facility] for evaluation  Woodroe Mode, MD 12/11/2016

## 2016-12-11 NOTE — Patient Instructions (Signed)
Fibromas uterinos (Uterine Fibroids) Los fibromas uterinos son masas (tumores) de tejido que pueden desarrollarse en el vientre (tero). Tambin se los conoce como liomiomas. Este tipo de tumor no es canceroso (benigno) y no se disemina a otras partes del cuerpo fuera de la zona plvica, la cual se encuentra entre los huesos de la cadera. En ocasiones, los fibromas pueden crecer en las trompas de [place], en el cuello del tero o en las estructuras de soporte (ligamentos) que rodean el tero. Una mujer puede tener uno o ms fibromas. Los fibromas pueden tener diferente tamao y peso, y crecer en distintas partes del tero. Algunos pueden crecer hasta volverse bastante grandes. La mayora no requiere tratamiento mdico. CAUSAS Un fibroma puede desarrollarse cuando una nica clula uterina contina creciendo (se multiplica). La mayora de las clulas del cuerpo humano tienen un mecanismo de control que impide que se multipliquen sin control. SIGNOS Y SNTOMAS Entre los sntomas se pueden incluir los siguientes:  Hemorragias intensas durante la menstruacin.  Prdidas de sangre o hemorragias entre los perodos.  Dolor y opresin en la pelvis.  Problemas de la vejiga, como necesidad de orinar con ms frecuencia (polaquiuria) o necesidad imperiosa de orinar.  Incapacidad para reproducir (infertilidad).  Abortos espontneos. DIAGNSTICO Los fibromas uterinos se diagnostican con un examen fsico. El mdico puede palpar los tumores grumosos durante un examen plvico. Pueden realizarse ecografas y una resonancia magntica para determinar el tamao y la ubicacin de los fibromas, as como la cantidad. TRATAMIENTO El tratamiento puede incluir lo siguiente:  Observacin cautelosa. Esto requiere que el mdico controle el fibroma para saber si crece o se achica. Siga las recomendaciones del mdico respecto de la frecuencia con la que debe realizarse los controles.  Medicamentos hormonales. Pueden  tomarse por va oral o administrarse a travs de un dispositivo intrauterino (DIU).  Ciruga. ? Extirpacin de los fibromas (miomectoma) o del tero (histerectoma). ? Suprimir la irrigacin sangunea a los fibromas (embolizacin de la arteria uterina). Si los fibromas le traen problemas de fertilidad y tiene deseos de quedar embarazada, el mdico puede recomendar su extirpacin. INSTRUCCIONES PARA EL CUIDADO EN EL HOGAR  Concurra a todas las visitas de control como se lo haya indicado el mdico. Esto es importante.  Tome los medicamentos de venta libre y los recetados solamente como se lo haya indicado el mdico. ? Si le recetaron un tratamiento hormonal, tome los medicamentos hormonales exactamente como se lo indicaron.  Consulte al mdico sobre tomar comprimidos de hierro y aumentar la cantidad de verduras de hoja color verde oscuro en la dieta. Estas medidas pueden ayudar a incrementar los niveles de hierro en la sangre, que pueden verse afectados por las hemorragias menstruales intensas.  Preste mucha atencin a la menstruacin e informe al mdico si hay algn cambio, por ejemplo: ? Aumento del flujo de sangre que le exige el uso de ms compresas o tampones que los que utiliza normalmente cada mes. ? Un cambio en la cantidad de das que le dura la menstruacin cada mes. ? Un cambio en los sntomas asociados con la menstruacin, como clicos abdominales o dolor de espalda. SOLICITE ATENCIN MDICA SI:  Tiene dolor plvico, dolor de espalda o clicos abdominales que los medicamentos no pueden controlar.  Observa un aumento del sangrado entre y durante las menstruaciones.  Empapa los tampones o las compresas en el trmino de media hora o menos tiempo.  Se siente mareada, muy cansada o dbil. SOLICITE ATENCIN MDICA DE INMEDIATO SI:  Se desmaya.    El dolor plvico aumenta repentinamente. Esta informacin no tiene como fin reemplazar el consejo del mdico. Asegrese de hacerle al  mdico cualquier pregunta que tenga. Document Released: 06/12/2005 Document Revised: 10/04/2015 Document Reviewed: 12/09/2013 Elsevier Interactive Patient Education  2018 Elsevier Inc.  

## 2017-12-25 ENCOUNTER — Encounter (HOSPITAL_COMMUNITY): Payer: Self-pay | Admitting: Emergency Medicine

## 2017-12-25 ENCOUNTER — Emergency Department (HOSPITAL_COMMUNITY): Payer: Self-pay

## 2017-12-25 ENCOUNTER — Other Ambulatory Visit: Payer: Self-pay

## 2017-12-25 ENCOUNTER — Emergency Department (HOSPITAL_COMMUNITY)
Admission: EM | Admit: 2017-12-25 | Discharge: 2017-12-25 | Disposition: A | Discharge status: Home / Self Care | Payer: Self-pay | Attending: Emergency Medicine | Admitting: Emergency Medicine

## 2017-12-25 DIAGNOSIS — M62838 Other muscle spasm: Secondary | ICD-10-CM | POA: Insufficient documentation

## 2017-12-25 LAB — BASIC METABOLIC PANEL
Anion gap: 9 (ref 5–15)
BUN: 14 mg/dL (ref 6–20)
CALCIUM: 9.1 mg/dL (ref 8.9–10.3)
CO2: 24 mmol/L (ref 22–32)
Chloride: 105 mmol/L (ref 98–111)
Creatinine, Ser: 0.59 mg/dL (ref 0.44–1.00)
GFR calc Af Amer: >60 mL/min (ref >60)
GLUCOSE: 96 mg/dL (ref 70–99)
POTASSIUM: 3.6 mmol/L (ref 3.5–5.1)
Sodium: 138 mmol/L (ref 135–145)

## 2017-12-25 LAB — I-STAT TROPONIN, ED: TROPONIN I, POC: 0 ng/mL (ref 0.00–0.08)

## 2017-12-25 LAB — CBC
HCT: 38.6 % (ref 36.0–46.0)
Hemoglobin: 13 g/dL (ref 12.0–15.0)
MCH: 29.7 pg (ref 26.0–34.0)
MCHC: 33.7 g/dL (ref 30.0–36.0)
MCV: 88.3 fL (ref 78.0–100.0)
Platelets: 270 10*3/uL (ref 150–400)
RBC: 4.37 MIL/uL (ref 3.87–5.11)
RDW: 13.4 % (ref 11.5–15.5)
WBC: 8.7 10*3/uL (ref 4.0–10.5)

## 2017-12-25 LAB — I-STAT BETA HCG BLOOD, ED (MC, WL, AP ONLY): I-stat hCG, quantitative: <5 m[IU]/mL (ref <5)

## 2017-12-25 MED ORDER — KETOROLAC TROMETHAMINE 60 MG/2ML IM SOLN
30.0000 mg | Freq: Once | INTRAMUSCULAR | Status: AC
  Administered 2017-12-25: 30 mg via INTRAMUSCULAR
  Filled 2017-12-25: qty 2

## 2017-12-25 MED ORDER — CYCLOBENZAPRINE HCL 10 MG PO TABS
5.0000 mg | ORAL_TABLET | Freq: Once | ORAL | Status: AC
  Administered 2017-12-25: 5 mg via ORAL
  Filled 2017-12-25: qty 1

## 2017-12-25 MED ORDER — CYCLOBENZAPRINE HCL 10 MG PO TABS: 10.0000 mg | ORAL_TABLET | Freq: Every day | ORAL | 0 refills | Status: AC

## 2017-12-25 NOTE — Discharge Instructions (Signed)
Puede tomar Motrin (Ibuprofen) o Aleve (Naproxen), Acetaminophen (Tylenol), crema para los musculos como SalonPas, Icy Hot, Bengay, etc. Puede estrechar, ponerce hielo o comprecion de calor, o que le den masaje. ° °

## 2017-12-25 NOTE — ED Triage Notes (Signed)
Patient is having a numbness going down shoulder to her fingers on left side of her body. Patient states that this has happened the last three nights. Patient is worried.

## 2017-12-25 NOTE — ED Provider Notes (Signed)
[facility] DEPT Provider Note  CSN: [id number] Arrival date & time: 12/25/17 0108  Chief Complaint(s) Chest Pain  HPI Monica Hicks is a 42 y.o. female   The history is provided by the patient.  Chest Pain   This is a new problem. Episode onset: 3 days. The problem occurs constantly. Progression since onset: Fluctuating. The pain is associated with movement and raising an arm. The pain is present in the lateral region (left). The pain is moderate. The quality of the pain is described as sharp and stabbing. The pain radiates to the left arm and upper back. Duration of episode(s) is 3 days. The symptoms are aggravated by certain positions. Associated symptoms include back pain. Pertinent negatives include no cough, no fever, no headaches, no irregular heartbeat, no leg pain, no lower extremity edema, no malaise/fatigue, no nausea and no shortness of breath. She has tried nothing for the symptoms.  Pertinent negatives for past medical history include no CAD, no COPD, no diabetes, no DVT, no hyperlipidemia, no hypertension, no MI, no PE and no TIA.  Pertinent negatives for family medical history include: no early MI.    Past Medical History Past Medical History:  Diagnosis Date  . GERD (gastroesophageal reflux disease)    Patient Active Problem List   Diagnosis Date Noted  . Pelvic pain in female 11/08/2016  . Abdominal pain, other specified site 06/09/2013  . Headache(784.0) 06/09/2013  . Unspecified constipation 06/09/2013  . Menorrhagia 06/09/2013   Home Medication(s) Prior to Admission medications   Medication Sig Start Date End Date Taking? Authorizing Provider  cyclobenzaprine (FLEXERIL) 10 MG tablet Take 1 tablet (10 mg total) by mouth at bedtime for 10 days. 12/25/17 01/04/18  Fatima Blank, MD  HYDROcodone-acetaminophen (NORCO/VICODIN) 5-325 MG tablet Take 1 tablet by mouth every 4 (four) hours as needed. Patient not taking:  Reported on 11/08/2016 10/12/16   Tegeler, Gwenyth Allegra, MD  omeprazole (PRILOSEC) 20 MG capsule Take 1 capsule (20 mg total) by mouth 2 (two) times daily. Patient not taking: Reported on 11/08/2016 02/16/16   Tanna Furry, MD  ondansetron (ZOFRAN ODT) 4 MG disintegrating tablet Take 1 tablet (4 mg total) by mouth every 8 (eight) hours as needed for nausea. Patient not taking: Reported on 11/08/2016 02/16/16   Tanna Furry, MD  ondansetron (ZOFRAN) 4 MG tablet Take 1 tablet (4 mg total) by mouth every 8 (eight) hours as needed for nausea or vomiting. Patient not taking: Reported on 11/08/2016 10/12/16   Tegeler, Gwenyth Allegra, MD  sucralfate (CARAFATE) 1 g tablet Take 1 tablet (1 g total) by mouth 4 (four) times daily. Patient not taking: Reported on 11/08/2016 02/16/16   Tanna Furry, MD                                                                                                                                    Past Surgical History Past Surgical History:  Procedure Laterality Date  . CESAREAN SECTION     Family History Family History  Problem Relation Age of Onset  . GER disease Father     Social History Social History   Tobacco Use  . Smoking status: Never Smoker  . Smokeless tobacco: Never Used  Substance Use Topics  . Alcohol use: No  . Drug use: No   Allergies Patient has no known allergies.  Review of Systems Review of Systems  Constitutional: Negative for fever and malaise/fatigue.  Respiratory: Negative for cough and shortness of breath.   Cardiovascular: Positive for chest pain.  Gastrointestinal: Negative for nausea.  Musculoskeletal: Positive for back pain.  Neurological: Negative for headaches.   All other systems are reviewed and are negative for acute change except as noted in the HPI  Physical Exam Vital Signs  I have reviewed the triage vital signs BP (!) 143/95 (BP Location: Right Arm)   Pulse 94   Temp 99.6 F (37.6 C) (Oral)   Resp 18   Ht 5\' 1"   (1.549 m)   Wt 80.7 kg (178 lb)   LMP 12/02/2017   SpO2 98%   BMI 33.63 kg/m   Physical Exam  Constitutional: She is oriented to person, place, and time. She appears well-developed and well-nourished. No distress.  HENT:  Head: Normocephalic and atraumatic.  Nose: Nose normal.  Eyes: Pupils are equal, round, and reactive to light. Conjunctivae and EOM are normal. Right eye exhibits no discharge. Left eye exhibits no discharge. No scleral icterus.  Neck: Normal range of motion. Neck supple.  Cardiovascular: Normal rate and regular rhythm. Exam reveals no gallop and no friction rub.  No murmur heard. Pulmonary/Chest: Effort normal and breath sounds normal. No stridor. No respiratory distress. She has no rales.     She exhibits tenderness.    Abdominal: Soft. She exhibits no distension. There is no tenderness.  Musculoskeletal: She exhibits no edema or tenderness.  Neurological: She is alert and oriented to person, place, and time.  Skin: Skin is warm and dry. No rash noted. She is not diaphoretic. No erythema.  Psychiatric: She has a normal mood and affect.  Vitals reviewed.   ED Results and Treatments Labs (all labs ordered are listed, but only abnormal results are displayed) Labs Reviewed  BASIC METABOLIC PANEL  CBC  I-STAT TROPONIN, ED  I-STAT BETA HCG BLOOD, ED ([facility], [facility], [facility] ONLY)                                                                                                                         EKG  EKG Interpretation  Date/Time:  Tuesday December 25 2017 01:24:03 EDT Ventricular Rate:  89 PR Interval:    QRS Duration: 85 QT Interval:  366 QTC Calculation: 446 R Axis:   6 Text Interpretation:  Sinus rhythm Low voltage, precordial leads Abnormal R-wave progression, early transition No significant change since last tracing Confirmed by Addison Lank [phone]) on 12/25/2017 2:30:29 AM  Radiology Dg Chest 2 View  Result Date: 12/25/2017 CLINICAL DATA:  Chest pain  EXAM: CHEST - 2 VIEW COMPARISON:  08/04/2014 FINDINGS: The heart size and mediastinal contours are within normal limits. Both lungs are clear. The visualized skeletal structures are unremarkable. IMPRESSION: No active cardiopulmonary disease. Electronically Signed   By: Donavan Foil M.D.   On: 12/25/2017 02:03   Pertinent labs & imaging results that were available during my care of the patient were reviewed by me and considered in my medical decision making (see chart for details).  Medications Ordered in ED Medications  ketorolac (TORADOL) injection 30 mg (30 mg Intramuscular Given 12/25/17 0307)  cyclobenzaprine (FLEXERIL) tablet 5 mg (5 mg Oral Given 12/25/17 0306)                                                                                                                                    Procedures Procedures  (including critical care time)  Medical Decision Making / ED Course I have reviewed the nursing notes for this encounter and the patient's prior records (if available in EHR or on provided paperwork).    Atypical chest pain are most consistent with muscle spasm/strain of the left shoulder girdle muscles.  Highly inconsistent with ACS.  EKG without acute ischemic changes or evidence of pericarditis.  Initial troponin drawn in triage negative.  Feel this is sufficient to rule out ACS given the duration of her symptomatology.  Low pretest probability for pulmonary embolism.  Presentation not classic for aortic dissection or esophageal perforation.Chest x-ray without evidence suggestive of pneumonia, pneumothorax, pneumomediastinum.  No abnormal contour of the mediastinum to suggest dissection. No evidence of acute injuries.  Labs without anemia.  No electrolyte derangements.  The patient appears reasonably screened and/or stabilized for discharge and I doubt any other medical condition or other [facility] requiring further screening, evaluation, or treatment in the ED at this time prior to  discharge.  The patient is safe for discharge with strict return precautions.   Final Clinical Impression(s) / ED Diagnoses Final diagnoses:  Muscle spasm of shoulder region    Disposition: Discharge  Condition: Good  I have discussed the results, Dx and Tx plan with the patient who expressed understanding and agree(s) with the plan. Discharge instructions discussed at great length. The patient was given strict return precautions who verbalized understanding of the instructions. No further questions at time of discharge.    ED Discharge Orders        Ordered    cyclobenzaprine (FLEXERIL) 10 MG tablet  Daily at bedtime     12/25/17 0316       Follow Up: Primary care provider   If you do not have a primary care physician, contact [facility] at [phone] for referral     This chart was dictated using voice recognition software.  Despite best efforts to proofread,  errors can occur which can change the documentation meaning.  Fatima Blank, MD 12/25/17 [phone]

## 2018-09-12 ENCOUNTER — Emergency Department (HOSPITAL_COMMUNITY)
Admission: EM | Admit: 2018-09-12 | Discharge: 2018-09-12 | Disposition: A | Discharge status: Home / Self Care | Payer: Self-pay | Attending: Emergency Medicine | Admitting: Emergency Medicine

## 2018-09-12 ENCOUNTER — Other Ambulatory Visit: Payer: Self-pay

## 2018-09-12 ENCOUNTER — Encounter (HOSPITAL_COMMUNITY): Payer: Self-pay | Admitting: *Deleted

## 2018-09-12 ENCOUNTER — Emergency Department (HOSPITAL_COMMUNITY): Payer: Self-pay

## 2018-09-12 DIAGNOSIS — R059 Cough, unspecified: Secondary | ICD-10-CM

## 2018-09-12 DIAGNOSIS — R05 Cough: Secondary | ICD-10-CM

## 2018-09-12 DIAGNOSIS — J101 Influenza due to other identified influenza virus with other respiratory manifestations: Secondary | ICD-10-CM | POA: Insufficient documentation

## 2018-09-12 DIAGNOSIS — J111 Influenza due to unidentified influenza virus with other respiratory manifestations: Secondary | ICD-10-CM

## 2018-09-12 LAB — INFLUENZA PANEL BY PCR (TYPE A & B)
Influenza A By PCR: POSITIVE — AB
Influenza B By PCR: NEGATIVE

## 2018-09-12 MED ORDER — HYDROCODONE-ACETAMINOPHEN 5-325 MG PO TABS
1.0000 | ORAL_TABLET | Freq: Once | ORAL | Status: AC
  Administered 2018-09-12: 1 via ORAL
  Filled 2018-09-12: qty 1

## 2018-09-12 NOTE — ED Provider Notes (Signed)
[facility] DEPT Provider Note   CSN: [id number] Arrival date & time: 09/12/18  [place]    History   Chief Complaint Chief Complaint  Patient presents with  . Cough  . Fever  . Back Pain    HPI Abigial Reyes-Dealeman is a 43 y.o. female.     44 year old female who presents with 2 days of cough and congestion.  Notes positive sick exposures from her husband.  Denies any vomiting or diarrhea.  Mild cephalgia without photophobia or neck discomfort.  Denies severe dyspnea.  Has been medicating with Motrin without relief.  Denies any rashes.  No abdominal chest discomfort.  Nothing makes her symptoms better.  No recent history of travel.  Interpreter used.     Past Medical History:  Diagnosis Date  . GERD (gastroesophageal reflux disease)     Patient Active Problem List   Diagnosis Date Noted  . Pelvic pain in female 11/08/2016  . Abdominal pain, other specified site 06/09/2013  . Headache(784.0) 06/09/2013  . Unspecified constipation 06/09/2013  . Menorrhagia 06/09/2013    Past Surgical History:  Procedure Laterality Date  . CESAREAN SECTION       OB History    Gravida  4   Para  3   Term  3   Preterm      AB  1   Living  3     SAB      TAB  1   Ectopic      Multiple      Live Births               Home Medications    Prior to Admission medications   Medication Sig Start Date End Date Taking? Authorizing Provider  HYDROcodone-acetaminophen (NORCO/VICODIN) 5-325 MG tablet Take 1 tablet by mouth every 4 (four) hours as needed. Patient not taking: Reported on 11/08/2016 10/12/16   Tegeler, Gwenyth Allegra, MD  omeprazole (PRILOSEC) 20 MG capsule Take 1 capsule (20 mg total) by mouth 2 (two) times daily. Patient not taking: Reported on 11/08/2016 02/16/16   Tanna Furry, MD  ondansetron (ZOFRAN ODT) 4 MG disintegrating tablet Take 1 tablet (4 mg total) by mouth every 8 (eight) hours as needed for nausea. Patient not  taking: Reported on 11/08/2016 02/16/16   Tanna Furry, MD  ondansetron (ZOFRAN) 4 MG tablet Take 1 tablet (4 mg total) by mouth every 8 (eight) hours as needed for nausea or vomiting. Patient not taking: Reported on 11/08/2016 10/12/16   Tegeler, Gwenyth Allegra, MD  sucralfate (CARAFATE) 1 g tablet Take 1 tablet (1 g total) by mouth 4 (four) times daily. Patient not taking: Reported on 11/08/2016 02/16/16   Tanna Furry, MD    Family History Family History  Problem Relation Age of Onset  . GER disease Father     Social History Social History   Tobacco Use  . Smoking status: Never Smoker  . Smokeless tobacco: Never Used  Substance Use Topics  . Alcohol use: No  . Drug use: No     Allergies   Patient has no known allergies.   Review of Systems Review of Systems  All other systems reviewed and are negative.    Physical Exam Updated Vital Signs BP 137/84 (BP Location: Left Arm)   Pulse (!) 101   Temp 99.6 F (37.6 C) (Oral)   Resp 18   LMP 09/07/2018   SpO2 100%   Physical Exam Vitals signs and nursing note reviewed.  Constitutional:  General: She is not in acute distress.    Appearance: Normal appearance. She is well-developed. She is not toxic-appearing.  HENT:     Head: Normocephalic and atraumatic.  Eyes:     General: Lids are normal.     Conjunctiva/sclera: Conjunctivae normal.     Pupils: Pupils are equal, round, and reactive to light.  Neck:     Musculoskeletal: Normal range of motion and neck supple.     Thyroid: No thyroid mass.     Trachea: No tracheal deviation.  Cardiovascular:     Rate and Rhythm: Normal rate and regular rhythm.     Heart sounds: Normal heart sounds. No murmur. No gallop.   Pulmonary:     Effort: Pulmonary effort is normal. No respiratory distress.     Breath sounds: Normal breath sounds. No stridor. No decreased breath sounds, wheezing, rhonchi or rales.  Abdominal:     General: Bowel sounds are normal. There is no distension.      Palpations: Abdomen is soft.     Tenderness: There is no abdominal tenderness. There is no rebound.  Musculoskeletal: Normal range of motion.        General: No tenderness.  Skin:    General: Skin is warm and dry.     Findings: No abrasion or rash.  Neurological:     Mental Status: She is alert and oriented to person, place, and time.     GCS: GCS eye subscore is 4. GCS verbal subscore is 5. GCS motor subscore is 6.     Cranial Nerves: No cranial nerve deficit.     Sensory: No sensory deficit.  Psychiatric:        Speech: Speech normal.        Behavior: Behavior normal.      ED Treatments / Results  Labs (all labs ordered are listed, but only abnormal results are displayed) Labs Reviewed - No data to display  EKG None  Radiology Dg Chest 2 View  Result Date: 09/12/2018 CLINICAL DATA:  Cough and fever EXAM: CHEST - 2 VIEW COMPARISON:  12/25/2017 FINDINGS: The heart size and mediastinal contours are within normal limits. Both lungs are clear. The visualized skeletal structures are unremarkable. IMPRESSION: No active cardiopulmonary disease. Electronically Signed   By: Franchot Gallo M.D.   On: 09/12/2018 19:52    Procedures Procedures (including critical care time)  Medications Ordered in ED Medications  HYDROcodone-acetaminophen (NORCO/VICODIN) 5-325 MG per tablet 1 tablet (has no administration in time range)     Initial Impression / Assessment and Plan / ED Course  I have reviewed the triage vital signs and the nursing notes.  Pertinent labs & imaging results that were available during my care of the patient were reviewed by me and considered in my medical decision making (see chart for details).        Patient's influenza test is positive for flu A.  Chest x-ray is negative.  Patient is out of the window for Tamiflu.  Will give supportive care  Final Clinical Impressions(s) / ED Diagnoses   Final diagnoses:  Cough    ED Discharge Orders    None        Lacretia Leigh, MD 09/12/18 2216

## 2018-09-12 NOTE — ED Triage Notes (Signed)
Pt reports generalized cough, fever, and back pain x 2 days.  She has taken a version of tylenol from [place] without relief.

## 2019-06-26 IMAGING — CR CHEST - 2 VIEW
2 series · 2 of 2 positions shown · non-contrast
Comparison: 12/25/2017

CLINICAL DATA: Cough and fever

EXAM:
CHEST - 2 VIEW

[w chest pa]
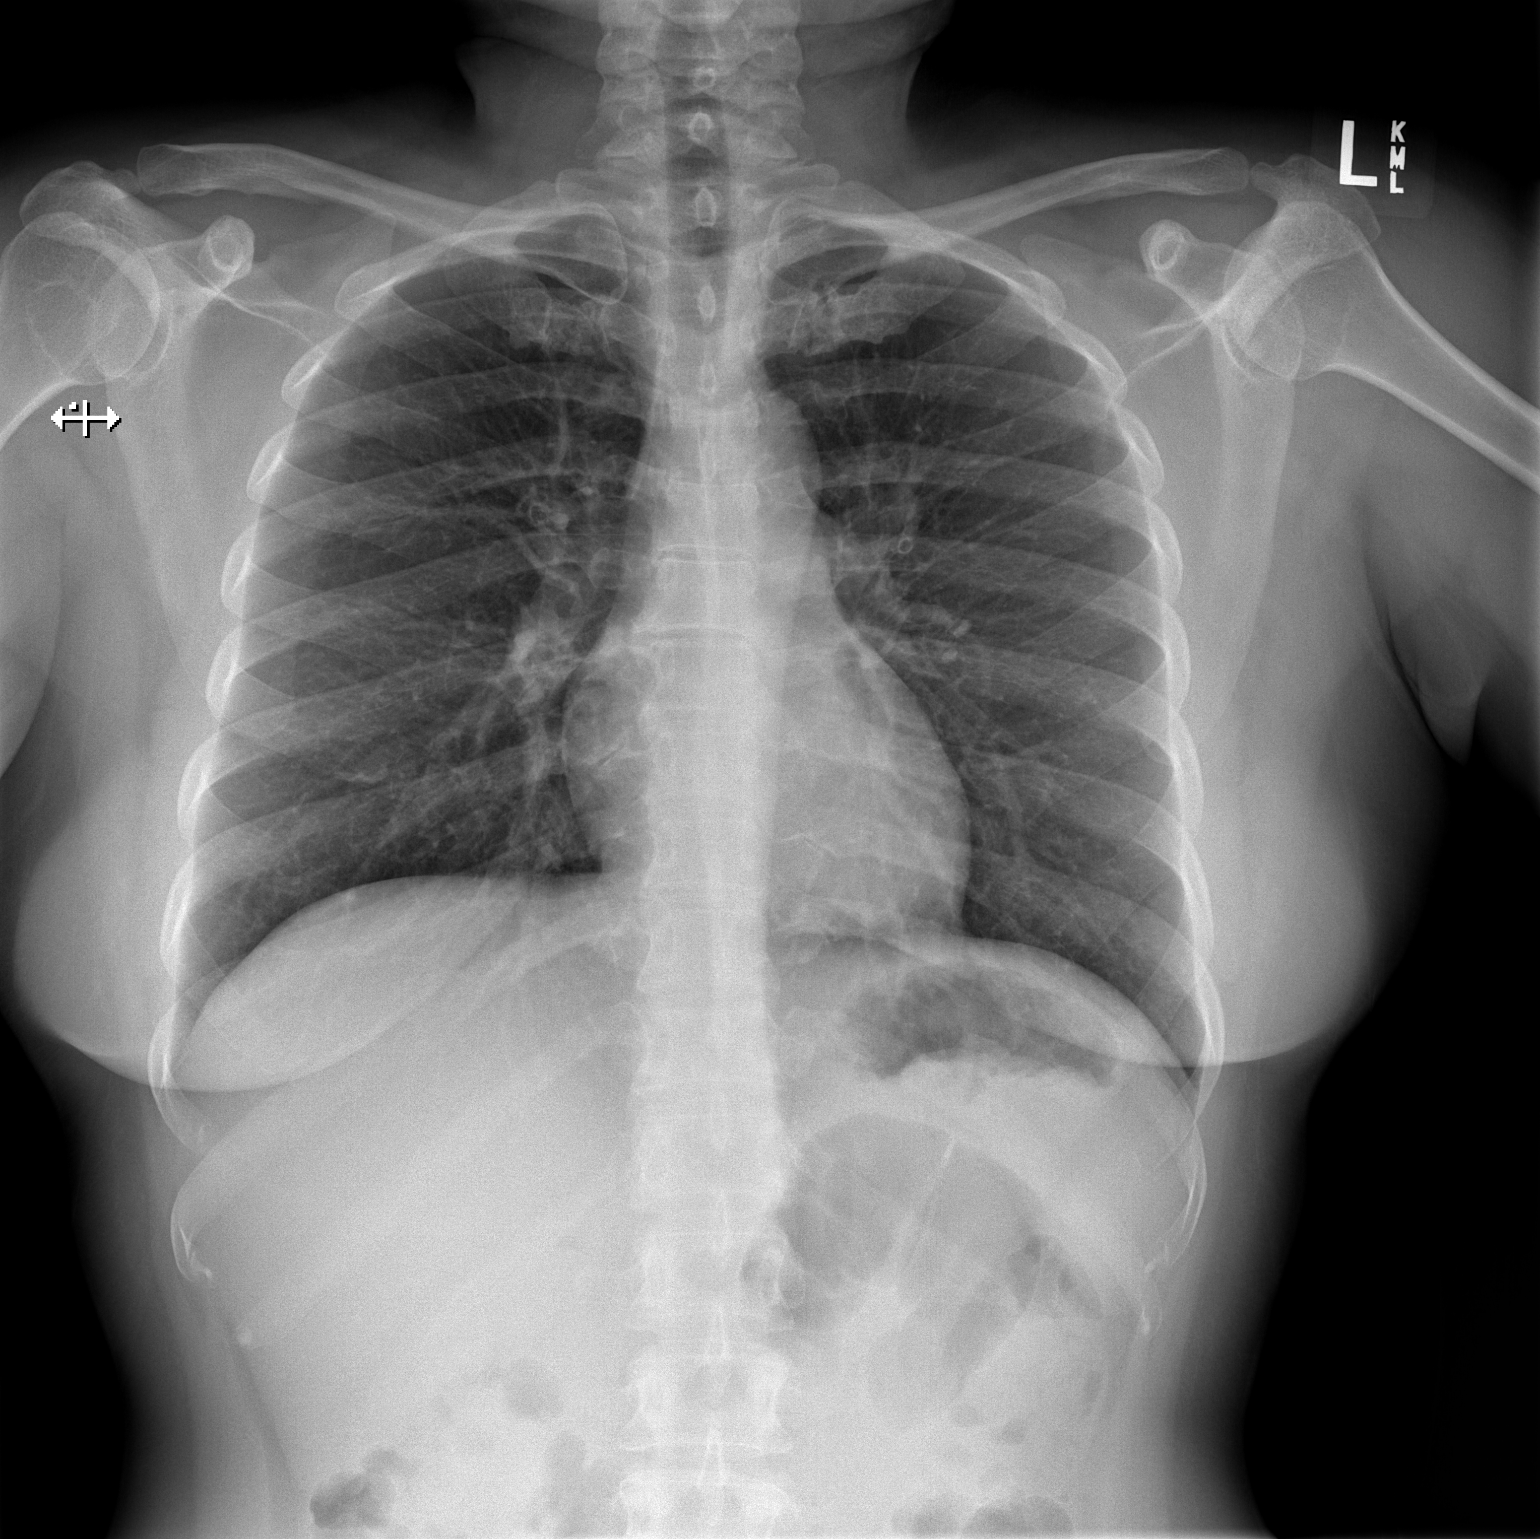

[w chest lat]
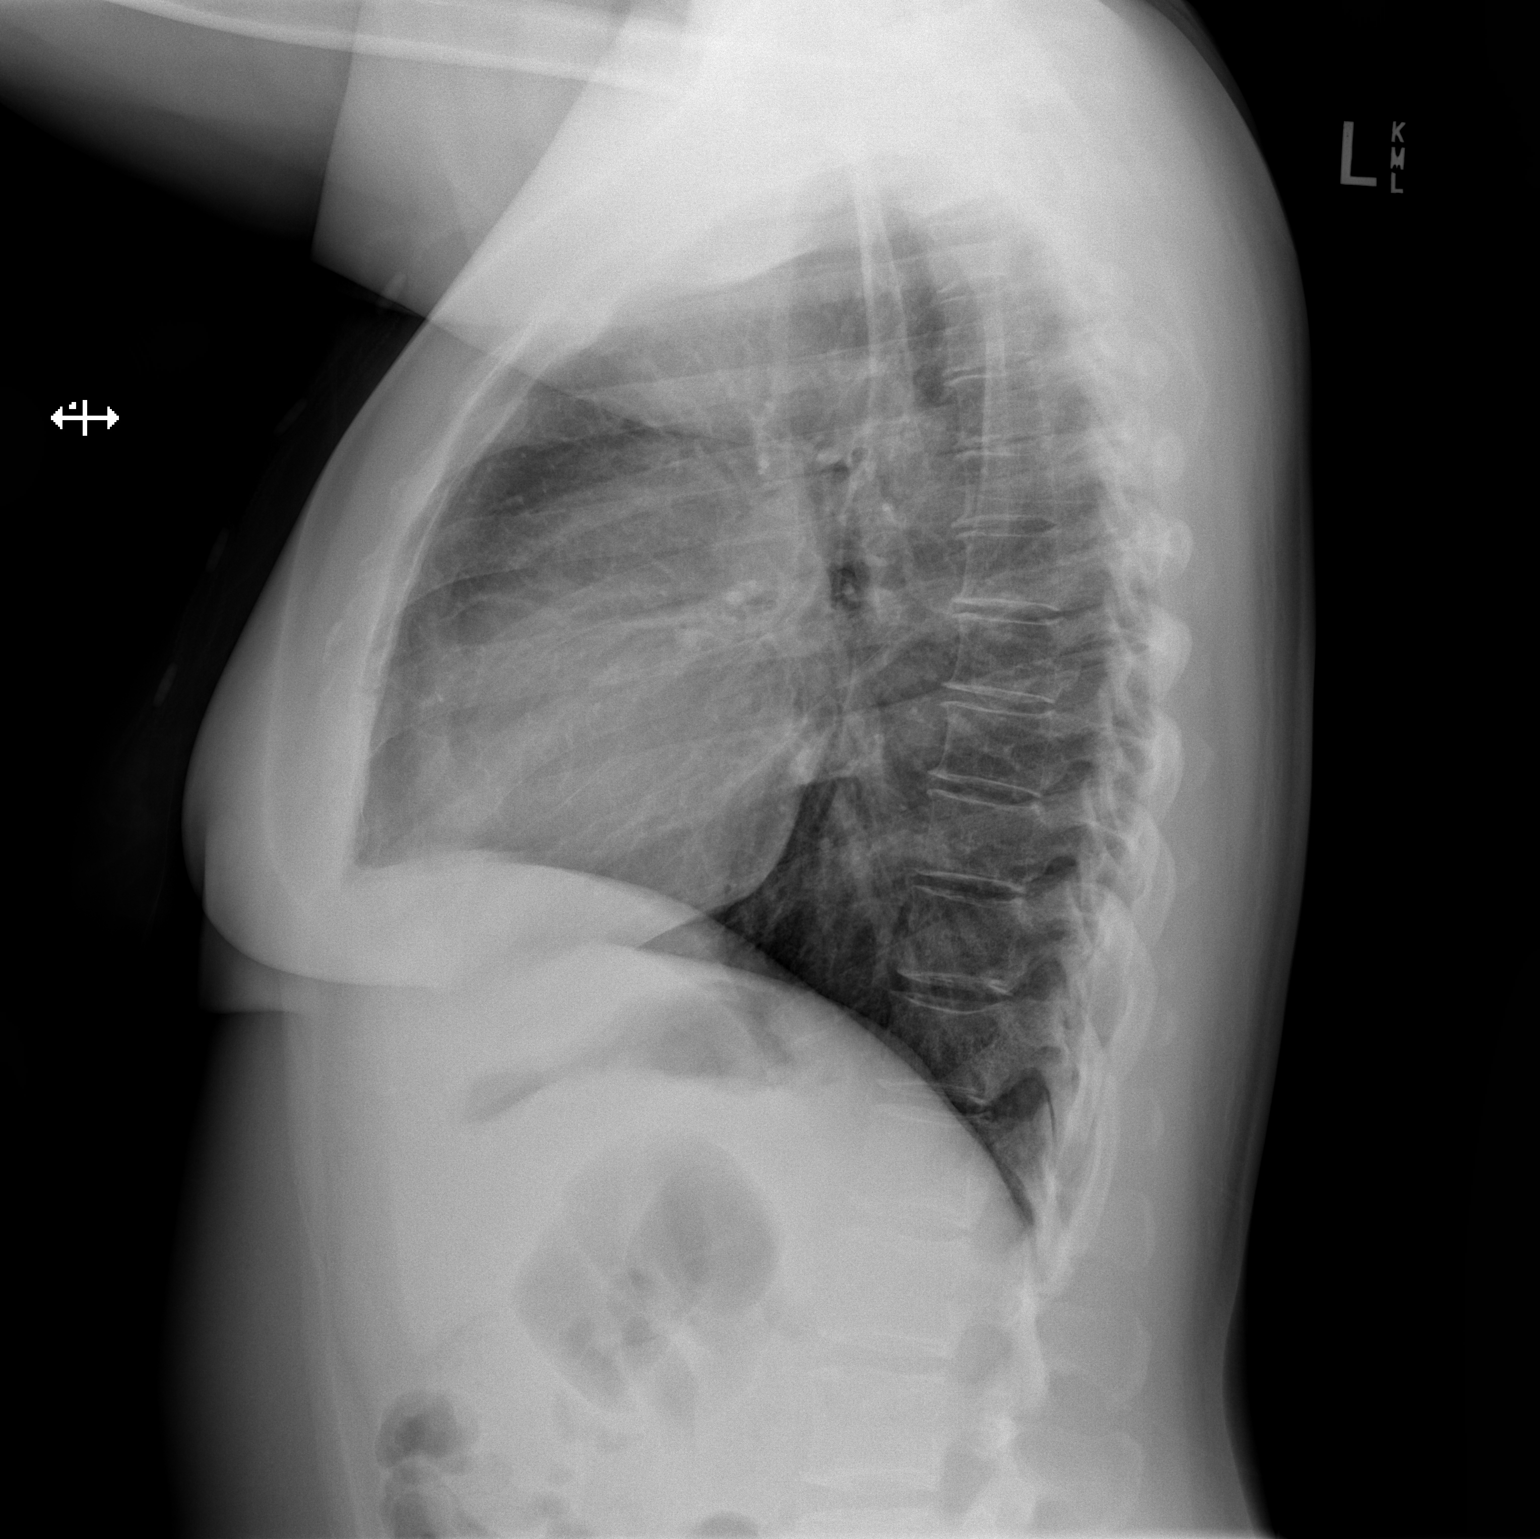

[2 of 2 positions shown; findings below may reference images not displayed]

FINDINGS: The heart size and mediastinal contours are within normal limits.
Both lungs are clear. The visualized skeletal structures are
unremarkable.
IMPRESSION: No active cardiopulmonary disease.
